# Patient Record
Sex: Female | Born: 1980 | Smoking: Former smoker
Health system: Southern US, Community
[De-identification: ages and names within clinical notes are randomized; demographics above are authoritative.]

## PROBLEM LIST (undated history)

## (undated) DIAGNOSIS — J302 Other seasonal allergic rhinitis: Secondary | ICD-10-CM

## (undated) DIAGNOSIS — T7840XA Allergy, unspecified, initial encounter: Secondary | ICD-10-CM

## (undated) DIAGNOSIS — F419 Anxiety disorder, unspecified: Secondary | ICD-10-CM

## (undated) DIAGNOSIS — L509 Urticaria, unspecified: Secondary | ICD-10-CM

## (undated) HISTORY — DX: Anxiety disorder, unspecified: F41.9

## (undated) HISTORY — DX: Other seasonal allergic rhinitis: J30.2

## (undated) HISTORY — DX: Allergy, unspecified, initial encounter: T78.40XA

## (undated) HISTORY — PX: TONSILLECTOMY AND ADENOIDECTOMY: SHX28

## (undated) HISTORY — PX: WISDOM TOOTH EXTRACTION: SHX21

## (undated) HISTORY — DX: Urticaria, unspecified: L50.9

---

## 2003-03-14 ENCOUNTER — Other Ambulatory Visit: Admission: RE | Admit: 2003-03-14 | Discharge: 2003-03-14 | Payer: Self-pay | Admitting: Obstetrics and Gynecology

## 2004-03-19 ENCOUNTER — Other Ambulatory Visit: Admission: RE | Admit: 2004-03-19 | Discharge: 2004-03-19 | Payer: Self-pay | Admitting: *Deleted

## 2004-03-19 ENCOUNTER — Other Ambulatory Visit: Admission: RE | Admit: 2004-03-19 | Discharge: 2004-03-19 | Payer: Self-pay | Admitting: Obstetrics and Gynecology

## 2005-03-21 ENCOUNTER — Other Ambulatory Visit: Admission: RE | Admit: 2005-03-21 | Discharge: 2005-03-21 | Payer: Self-pay | Admitting: Obstetrics and Gynecology

## 2007-01-04 ENCOUNTER — Other Ambulatory Visit: Admission: RE | Admit: 2007-01-04 | Discharge: 2007-01-04 | Payer: Self-pay | Admitting: Obstetrics & Gynecology

## 2008-01-14 ENCOUNTER — Other Ambulatory Visit: Admission: RE | Admit: 2008-01-14 | Discharge: 2008-01-14 | Payer: Self-pay | Admitting: Obstetrics & Gynecology

## 2009-01-31 ENCOUNTER — Other Ambulatory Visit: Admission: RE | Admit: 2009-01-31 | Discharge: 2009-01-31 | Payer: Self-pay | Admitting: Obstetrics & Gynecology

## 2012-04-20 LAB — HM PAP SMEAR: HM Pap smear: NEGATIVE

## 2013-04-21 ENCOUNTER — Encounter: Payer: Self-pay | Admitting: *Deleted

## 2013-04-25 ENCOUNTER — Ambulatory Visit (INDEPENDENT_AMBULATORY_CARE_PROVIDER_SITE_OTHER): Payer: BC Managed Care – PPO | Admitting: Nurse Practitioner

## 2013-04-25 ENCOUNTER — Encounter: Payer: Self-pay | Admitting: Nurse Practitioner

## 2013-04-25 VITALS — BP 112/60 | HR 66 | Ht 67.0 in | Wt 151.7 lb

## 2013-04-25 LAB — POCT URINALYSIS DIPSTICK: Spec Grav, UA: 1.015

## 2013-04-25 MED ORDER — NORGESTIM-ETH ESTRAD TRIPHASIC 0.18/0.215/0.25 MG-35 MCG PO TABS: 1.0000 | ORAL_TABLET | Freq: Every day | ORAL | Status: DC

## 2013-04-25 NOTE — Patient Instructions (Addendum)
EXERCISE AND DIET:  We recommended that you start or continue a regular exercise program for good health. Regular exercise means any activity that makes your heart beat faster and makes you sweat.  We recommend exercising at least 30 minutes per day at least 3 days a week, preferably 4 or 5.  We also recommend a diet low in fat and sugar.  Inactivity, poor dietary choices and obesity can cause diabetes, heart attack, stroke, and kidney damage, among others.    ALCOHOL AND SMOKING:  Women should limit their alcohol intake to no more than 7 drinks/beers/glasses of wine (combined, not each!) per week. Moderation of alcohol intake to this level decreases your risk of breast cancer and liver damage. And of course, no recreational drugs are part of a healthy lifestyle.  And absolutely no smoking or even second hand smoke. Most people know smoking can cause heart and lung diseases, but did you know it also contributes to weakening of your bones? Aging of your skin?  Yellowing of your teeth and nails?  CALCIUM AND VITAMIN D:  Adequate intake of calcium and Vitamin D are recommended.  The recommendations for exact amounts of these supplements seem to change often, but generally speaking 600 mg of calcium (either carbonate or citrate) and 800 units of Vitamin D per day seems prudent. Certain women may benefit from higher intake of Vitamin D.  If you are among these women, your doctor will have told you during your visit.    PAP SMEARS:  Pap smears, to check for cervical cancer or precancers,  have traditionally been done yearly, although recent scientific advances have shown that most women can have pap smears less often.  However, every woman still should have a physical exam from her gynecologist every year. It will include a breast check, inspection of the vulva and vagina to check for abnormal growths or skin changes, a visual exam of the cervix, and then an exam to evaluate the size and shape of the uterus and  ovaries.  And after 34 years of age, a rectal exam is indicated to check for rectal cancers. We will also provide age appropriate advice regarding health maintenance, like when you should have certain vaccines, screening for sexually transmitted diseases, bone density testing, colonoscopy, mammograms, etc.   MAMMOGRAMS:  All women over 40 years old should have a yearly mammogram. Many facilities now offer a "3D" mammogram, which may cost around $50 extra out of pocket. If possible,  we recommend you accept the option to have the 3D mammogram performed.  It both reduces the number of women who will be called back for extra views which then turn out to be normal, and it is better than the routine mammogram at detecting truly abnormal areas.    COLONOSCOPY:  Colonoscopy to screen for colon cancer is recommended for all women at age 50.  We know, you hate the idea of the prep.  We agree, BUT, having colon cancer and not knowing it is worse!!  Colon cancer so often starts as a polyp that can be seen and removed at colonscopy, which can quite literally save your life!  And if your first colonoscopy is normal and you have no family history of colon cancer, most women don't have to have it again for 10 years.  Once every ten years, you can do something that may end up saving your life, right?  We will be happy to help you get it scheduled when you are ready.    Be sure to check your insurance coverage so you understand how much it will cost.  It may be covered as a preventative service at no cost, but you should check your particular policy.    About 3 months prior to wanted pregnancy come in for pre- conceptual counseling.    Preparing for Pregnancy Preparing for pregnancy (preconceptual care) by getting counseling and information from your caregiver before getting pregnant is a good idea. It will help you and your baby have a better chance to have a healthy, safe pregnancy and delivery of your baby. Make an  appointment with your caregiver to talk about your health, medical, and family history and how to prepare yourself before getting pregnant. Your caregiver will do a complete physical exam and a Pap test. They will want to know:  About you, your spouse or partner, and your family's medical and genetic history.  If you are eating a balanced diet and drinking enough fluids.  What vitamins and mineral supplements you are taking. This includes taking folic acid before getting pregnant to help prevent birth defects.  What medications you are taking including prescription, over-the-counter and herbal medications.  If there is any substance abuse like alcohol, smoking, and illegal drugs.  If there is any mental or physical domestic violence.  If there is any risk of sexually transmitted disease between you and your partner.  What immunizations and vaccinations you have had and what you may need before getting pregnant.  If you should get tested for HIV infection.  If there is any exposure to chemical or toxic substances at home or work.  If there are medical problems you have that need to be treated and kept under control before getting pregnant such as diabetes, high blood pressure or others.  If there were any past surgeries, pregnancies and problems with them.  What your current weight is and to set a goal as to how much weight you should gain while pregnant. Also, they will check if you should lose or gain weight before getting pregnant.  What is your exercise routine and what it is safe when you are pregnant.  If there are any physical disabilities that need to be addressed.  About spacing your pregnancies when there are other children.  If there is a financial problem that may affect you having a child. After talking about the above points with your caregiver, your caregiver will give you advice on how to help treat and work with you on solving any issues, if necessary, before  getting pregnant. The goal is to have a healthy and safe pregnancy for you and your baby. You should keep an accurate record of your menstrual periods because it will help in determining your due date. Immunizations that you should have before getting pregnant:   Regular measles, [place] measles (rubella) and mumps.  Tetanus and diphtheria.  Chickenpox, if not immune.  Herpes zoster (Varicella) if not immune.  Human papilloma virus vaccine (HPV) between the age of 4 and 43 years old.  Hepatitis A vaccine.  Hepatitis B vaccine.  Influenza vaccine.  Pneumococcal vaccine (pneumonia). You should avoid getting pregnant for one month after getting vaccinated with a live virus vaccine such as [place] measles (rubella) vaccine. Other immunizations may be necessary depending on where you live, such as malaria. Ask your caregiver if any other immunizations are needed for you. HOME CARE INSTRUCTIONS   Follow the advice of your caregiver.  Before getting pregnant:  Begin taking vitamins, supplements,  and 0.4 milligrams folic acid daily.  Get your immunizations up-to-date.  Get help from a nutrition counselor if you do not understand what a balanced diet is, need help with a special medical diet or if you need help to lose or gain weight.  Begin exercising.  Stop smoking, taking illegal drugs, and drinking alcoholic beverages.  Get counseling if there is and type of domestic violence.  Get checked for sexually transmitted diseases including HIV.  Get any medical problems under control (diabetes, high blood pressure, convulsions, asthma or others).  Resolve any financial concerns.  Be sure you and your spouse or partner are ready to have a baby.  Keep an accurate record of your menstrual periods. Document Released: 10/23/2008 Document Revised: 02/02/2012 Document Reviewed: 10/23/2008 [facility] Patient Information 2014 [place], [place].

## 2013-04-25 NOTE — Progress Notes (Signed)
32 y.o. G0P0 Married Caucasian Fe here for annual exam.  Menses regular and last 3-4 days light. No cramps. Occ. Headaches - not migraine. Same partner and monogamous. No new health concerns. May plan pregnancy in 8 - 10 months.  Patient's last menstrual period was 04/13/2013.          Sexually active: yes  The current method of family planning is OCP (estrogen/progesterone).    Exercising: yes  running Smoker:  no  Health Maintenance: Pap:  04/20/2012  Normal with negative HR HPV MMG:  never TDaP:  01/14/2008 Labs: PCP does lab (blood) work.    reports that she has never smoked. She has never used smokeless tobacco. She reports that she drinks about 1.8 ounces of alcohol per week. She reports that she does not use illicit drugs.  Past Medical History  Diagnosis Date  . Hives     chronic history    Past Surgical History  Procedure Laterality Date  . Wisdom tooth extraction      Current Outpatient Prescriptions  Medication Sig Dispense Refill  . fexofenadine (ALLEGRA) 180 MG tablet Take 180 mg by mouth daily.      Lorita Officer Triphasic (ORTHO TRI-CYCLEN, 28, PO) Take by mouth daily.      . hydrOXYzine (ATARAX/VISTARIL) 10 MG tablet Take 10 mg by mouth 3 (three) times daily as needed for itching.       No current facility-administered medications for this visit.    Family History  Problem Relation Age of Onset  . Breast cancer Mother   . Breast cancer Maternal Grandmother     ROS:  Pertinent items are noted in HPI.  Otherwise, a comprehensive ROS was negative.  Exam:   BP 112/60  Pulse 66  Ht 5\' 7"  (1.702 m)  Wt 151 lb 11.2 oz (68.811 kg)  BMI 23.75 kg/m2  LMP 04/13/2013 Height: 5\' 7"  (170.2 cm)  Ht Readings from Last 3 Encounters:  04/25/13 5\' 7"  (1.702 m)    General appearance: alert, cooperative and appears stated age Head: Normocephalic, without obvious abnormality, atraumatic Neck: no adenopathy, supple, symmetrical, trachea midline and thyroid  normal to inspection and palpation Lungs: clear to auscultation bilaterally Breasts: normal appearance, no masses or tenderness Heart: regular rate and rhythm Abdomen: soft, non-tender; no masses,  no organomegaly Extremities: extremities normal, atraumatic, no cyanosis or edema Skin: Skin color, texture, turgor normal. No rashes or lesions Lymph nodes: Cervical, supraclavicular, and axillary nodes normal. No abnormal inguinal nodes palpated Neurologic: Grossly normal   Pelvic: External genitalia:  no lesions              Urethra:  normal appearing urethra with no masses, tenderness or lesions              Bartholin's and Skene's: normal                 Vagina: normal appearing vagina with normal color and discharge, no lesions              Cervix: anteverted              Pap taken: no Bimanual Exam:  Uterus:  normal size, contour, position, consistency, mobility, non-tender              Adnexa: no mass, fullness, tenderness               Rectovaginal: Confirms               Anus:  normal sphincter tone, no lesions  A:  Well Woman with normal exam  Contraception  May consider pregnancy in about 8-10 months  P:   Pap smear as per guidelines   Refill OCP for the next year  Advised need for preconceptual counseling 3 months prior to coming off OCP  Will get updated immunization list  counseled on adequate intake of calcium and vitamin D, diet and exercise return annually or prn  An After Visit Summary was printed and given to the patient.

## 2013-04-27 NOTE — Progress Notes (Signed)
Reviewed personally. 

## 2013-09-29 ENCOUNTER — Other Ambulatory Visit: Payer: Self-pay

## 2014-04-25 ENCOUNTER — Other Ambulatory Visit: Payer: Self-pay | Admitting: Nurse Practitioner

## 2014-04-25 NOTE — Telephone Encounter (Signed)
Last AEX 04/25/13 #84/3 refills was sent to pharmacy AEX scheduled for 04/27/14  Imelda Pillow #84/0refills sent in to pharmacy to last patient

## 2014-04-27 ENCOUNTER — Encounter: Payer: Self-pay | Admitting: Nurse Practitioner

## 2014-04-27 ENCOUNTER — Ambulatory Visit (INDEPENDENT_AMBULATORY_CARE_PROVIDER_SITE_OTHER): Payer: BC Managed Care – PPO | Admitting: Nurse Practitioner

## 2014-04-27 VITALS — BP 134/82 | HR 64 | Ht 67.5 in | Wt 147.0 lb

## 2014-04-27 DIAGNOSIS — Z01419 Encounter for gynecological examination (general) (routine) without abnormal findings: Secondary | ICD-10-CM

## 2014-04-27 DIAGNOSIS — Z0000 Encounter for general adult medical examination without abnormal findings: Secondary | ICD-10-CM

## 2014-04-27 DIAGNOSIS — B3731 Acute candidiasis of vulva and vagina: Secondary | ICD-10-CM

## 2014-04-27 DIAGNOSIS — R3989 Other symptoms and signs involving the genitourinary system: Secondary | ICD-10-CM

## 2014-04-27 LAB — POCT URINALYSIS DIPSTICK
Bilirubin, UA: NEGATIVE
Blood, UA: NEGATIVE
Glucose, UA: NEGATIVE
Ketones, UA: NEGATIVE
Leukocytes, UA: NEGATIVE
Nitrite, UA: NEGATIVE
Protein, UA: NEGATIVE
Urobilinogen, UA: NEGATIVE
pH, UA: 5

## 2014-04-27 MED ORDER — NORGESTIM-ETH ESTRAD TRIPHASIC 0.18/0.215/0.25 MG-35 MCG PO TABS: ORAL_TABLET | ORAL | Status: DC

## 2014-04-27 MED ORDER — FLUCONAZOLE 150 MG PO TABS: 150.0000 mg | ORAL_TABLET | Freq: Once | ORAL | Status: DC

## 2014-04-27 MED ORDER — NYSTATIN-TRIAMCINOLONE 100000-0.1 UNIT/GM-% EX OINT: 1.0000 "application " | TOPICAL_OINTMENT | Freq: Two times a day (BID) | CUTANEOUS | Status: DC

## 2014-04-27 NOTE — Patient Instructions (Addendum)

## 2014-04-27 NOTE — Progress Notes (Addendum)
Patient ID: Joanna Terry, female   DOB: October 06, 1981, 33 y.o.   MRN: [id number] 33 y.o. G0P0 Married Caucasian Fe here for annual exam.  Menses last 3 days.  Moderate to light.  No cramps, PMS.  At times has a lower pelvic pain or spasm that occurs after SA.  She does note some urinary frequency but no dysuria.  Last had the pain of Sunday again after SA.  Denies  fever, chills, back pain. Considering pregnancy this year.  Will return for preconceptual planning.  Patient's last menstrual period was 04/12/2014.          Sexually active: yes  The current method of family planning is OCP (estrogen/progesterone).  Exercising: yes running/strength training 4-5 times per week Smoker: no   Health Maintenance:  Pap: 04/20/2012 Normal with negative HR HPV  TDaP: 01/14/2008  Labs: PCP does lab (blood) work.  Urine:  neg     reports that she has never smoked. She has never used smokeless tobacco. She reports that she drinks about 1.8 ounces of alcohol per week. She reports that she does not use illicit drugs.  Past Medical History  Diagnosis Date  . Hives     chronic history    Past Surgical History  Procedure Laterality Date  . Wisdom tooth extraction    . Tonsillectomy and adenoidectomy  age 70 or 54    Current Outpatient Prescriptions  Medication Sig Dispense Refill  . fexofenadine (ALLEGRA) 180 MG tablet Take 180 mg by mouth daily.      . mometasone (NASONEX) 50 MCG/ACT nasal spray Place 2 sprays into the nose daily.      . Norgestimate-Ethinyl Estradiol Triphasic (TRINESSA, 28,) 0.18/0.215/0.25 MG-35 MCG tablet TAKE ONE TABLET BY MOUTH EVERY DAY  3 Package  3  . fluconazole (DIFLUCAN) 150 MG tablet Take 1 tablet (150 mg total) by mouth once. Take one tablet.  Repeat in 48 hours if symptoms are not completely resolved.  2 tablet  0  . nystatin-triamcinolone ointment (MYCOLOG) Apply 1 application topically 2 (two) times daily.  30 g  0   No current facility-administered medications for  this visit.    Family History  Problem Relation Age of Onset  . Breast cancer Mother 73    BRCA I/ II negative  . Breast cancer Maternal Grandmother 45    ROS:  Pertinent items are noted in HPI.  Otherwise, a comprehensive ROS was negative.  Exam:   BP 134/82  Pulse 64  Ht 5' 7.5" (1.715 m)  Wt 147 lb (66.679 kg)  BMI 22.67 kg/m2  LMP 04/12/2014 Height: 5' 7.5" (171.5 cm)  Ht Readings from Last 3 Encounters:  04/27/14 5' 7.5" (1.715 m)  04/25/13 5' 7"  (1.702 m)    General appearance: alert, cooperative and appears stated age Head: Normocephalic, without obvious abnormality, atraumatic Neck: no adenopathy, supple, symmetrical, trachea midline and thyroid normal to inspection and palpation Lungs: clear to auscultation bilaterally Breasts: normal appearance, no masses or tenderness Heart: regular rate and rhythm Abdomen: soft, non-tender; no masses,  no organomegaly Extremities: extremities normal, atraumatic, no cyanosis or edema Skin: Skin color, texture, turgor normal. No rashes or lesions Lymph nodes: Cervical, supraclavicular, and axillary nodes normal. No abnormal inguinal nodes palpated Neurologic: Grossly normal   Pelvic: External genitalia:  no lesions              Urethra:  normal appearing urethra with no masses, tenderness or lesions  Bartholin's and Skene's: normal                 Vagina: normal appearing vagina with normal color and discharge, no lesions              Cervix: anteverted              Pap taken: no Bimanual Exam:  Uterus:  normal size, contour, position, consistency, mobility, non-tender              Adnexa: no mass, fullness, tenderness               Rectovaginal: Confirms               Anus:  normal sphincter tone, no lesions  A:  Well Woman with normal exam  Contraception  Considering pregnancy this summer  P:   Reviewed health and wellness pertinent to exam  Pap smear not taken today  Counseled on breast self exam, use  and side effects of OCP's, adequate intake of calcium and vitamin D, diet and exercise return annually or prn  An After Visit Summary was printed and given to the patient.

## 2014-04-28 NOTE — Progress Notes (Signed)
Encounter reviewed by Dr. Brook Silva.  

## 2014-04-29 LAB — URINE CULTURE: Colony Count: NO GROWTH

## 2014-05-01 NOTE — Addendum Note (Signed)
Addended by: Luisa Dago on: 05/01/2014 01:46 PM   Modules accepted: Orders

## 2014-06-07 ENCOUNTER — Ambulatory Visit: Payer: BC Managed Care – PPO | Admitting: Nurse Practitioner

## 2014-06-12 ENCOUNTER — Encounter: Payer: Self-pay | Admitting: Nurse Practitioner

## 2014-06-12 ENCOUNTER — Ambulatory Visit (INDEPENDENT_AMBULATORY_CARE_PROVIDER_SITE_OTHER): Payer: BC Managed Care – PPO | Admitting: Nurse Practitioner

## 2014-06-12 VITALS — BP 116/68 | HR 76 | Ht 67.5 in | Wt 146.0 lb

## 2014-06-12 DIAGNOSIS — Z3009 Encounter for other general counseling and advice on contraception: Secondary | ICD-10-CM

## 2014-06-12 NOTE — Progress Notes (Signed)
33 y.o.  G0P0, Married Caucasian  Female and her husband presents here for preconceptual counseling.  They are wanting to start for a pregnancy in November.    Gynecological History:    Menarche:age 59 LMP:  06/07/14  Length of cycle:3-4 days Length of Menses: 28 days on OCP GYN infectious disease history:  (Abnormal pap, venereal warts, herpes, or other STD's ): No Current Birth Control method:OCP - Bertram Millard Last time birth control was used:currently.  PMH:  Any history of DM, HTN, epilepsy, Heart Murmur, or thyroid problems? No   If so, when did it begin? Are you or have you ever been anemic?Yes If so for how long?  In 2006 secondary to diet for about a year Have you ever had any accidents? No Do you have any allergies? Yes - seasonal Do you take any sedatives or tranquilizers? No Any domestic violence? No Any medications? Yes - For seasonal allergies (such as medications for acne - certain medications can cause birth defects and Ace inhibitors can cause kidney problems in the fetus.)  Patient's Past Medical History:  Have you ever had surgery related to female organs? No Past pregnancies/ complications/ or miscarriages/ abortions? No ETOH? Yes only rare social Tobacco Use?  No Drug use? No  Reviewed Medication list: Yes Current job exposure risk - toxins/ Lead/ Mercury No Hot tub/ sauna use? No Do you commonly run long distance or do strenuous exercise? Yes training for 1/2 marathon; runs 10 - 14 miles per week. Do you eat a strict vegetarian diet? No  Partners Past Medical History:  Have you ever had surgery related to female organs? No Previous Paternity? No Testicular Injury? No ETOH Yes just socially Tobacco use: No Drug use No Current medications:  OTC Claritan Current job exposure risk toxins/ lead/ mercury No Hot/ tub sauna use? No Do you commonly run long distance or do strenuous exercise? Yes training for a marathon in November and running 20 -26 miles per  week.   Patient's Family Medical history: No Partners Family Medical History: No  Ethnic background? Mediterranean/ Asian/Chinese/ Ashkenazi Jews / [place] / [place]   Patients [place]:      Partners [place]: Multiple Births:  No     Multiple Births ;No Genetic Disorders:  No     Genetic Disorders :  No  Sickle cell      Sickle Cell  Hemophilia      Hemophilia  Cystic Fibrosis      Cystic Fibrosis  Mental retardation     Mental retardation  Downs Syndrome     Downs Syndrome  Immunization Updates: Rubella Vaccine/ titer:  ? MMR II   At college entry -titer is done today. Chicken Pox / vaccination : History of disease Toxoplasmosis exposure (no changing litter box) No Tdap for pt. and partner Yes patient with documentation, partner will check with PCP. Hepatitis B (if at high risk) No Influenza vaccine who may get pregnant during the flu season Yes  Recommendations:   No ETOH / Tobacco / Drugs  Limit Caffeine  No Artificial Sweeteners  No raw beef  Restrict High fat foods  Limit servings of large fish (e.g., swordfish) to 1 X month  Foods associated with Listeria transmission ( e.g., sliced delicatessen meats & Cheese)  No hot / tub Saunas  OTC med list  Counseling:   Normal pregnancy rates 80 % within 1 year  Rx. Prenatal Multivitamins  Discussion of timing of  intercourse to ovulation  Labs: Rubella titer  Time spent in consultation with patient and husband was 20 minutes face to face:

## 2014-06-12 NOTE — Patient Instructions (Signed)
Preparing for Pregnancy Before trying to become pregnant, make an appointment with your health care provider (preconception care). The goal is to help you have a healthy, safe pregnancy. At your first appointment, your health care provider will:   Do a complete physical exam, including a Pap test.  Take a complete medical history.  Give you advice and help you resolve any problems. PRECONCEPTION CHECKLIST Here is a list of the basics to cover with your health care provider at your preconception visit:  Medical history.  Tell your health care provider about any diseases you have had. Many diseases can affect your pregnancy.  Include your partner's medical history and family history.  Make sure you have been tested for sexually transmitted infections (STIs). These can affect your pregnancy. In some cases, they can be passed to your baby. Tell your health care provider about any history of STIs.  Make sure your health care provider knows about any previous problems you have had with conception or pregnancy.  Tell your health care provider about any medicine you take. This includes herbal supplements and over-the-counter medicines.  Make sure all your immunizations are up-to-date. You may need to make additional appointments.  Ask your health care provider if you need any vaccinations or if there are any you should avoid.  Diet.  It is especially important to eat a healthy, balanced diet with the right nutrients when you are pregnant.  Ask your health care provider to help you get to a healthy weight before pregnancy.  If you are overweight, you are at higher risk for certain complications. These include high blood pressure, diabetes, and preterm birth.  If you are underweight, you are more likely to have a low-birth-weight baby.  Lifestyle.  Tell your health care provider about lifestyle factors such as alcohol use, drug use, or smoking.  Describe any harmful substances you may  be exposed to at work or home. These can include chemicals, pesticides, and radiation.  Mental health.  Let your health care provider know if you have been feeling depressed or anxious.  Let your health care provider know if you have a history of substance abuse.  Let your health care provider know if you do not feel safe at home. HOME INSTRUCTIONS TO PREPARE FOR PREGNANCY Follow your health care provider's advice and instructions.   Keep an accurate record of your menstrual periods. This makes it easier for your health care provider to determine your baby's due date.  Begin taking prenatal vitamins and folic acid supplements daily. Take them as directed by your health care provider.  Eat a balanced diet. Get help from a nutrition counselor if you have questions or need help.  Get regular exercise. Try to be active for at least 30 minutes a day most days of the week.  Quit smoking, if you smoke.  Do not drink alcohol.  Do not take illegal drugs.  Get medical problems, such as diabetes or high blood pressure, under control.  If you have diabetes, make sure you do the following:  Have good blood sugar control. If you have type 1 diabetes, use multiple daily doses of insulin. Do not use split-dose or premixed insulin.  Have an eye exam by a qualified eye care professional trained in caring for people with diabetes.  Get evaluated by your health care provider for cardiovascular disease.  Get to a healthy weight. If you are overweight or obese, reduce your weight with the help of a qualified health professional such  as a regisitered dietitian. Ask your health care provider what the right weight range is for you. HOW DO I KNOW I AM PREGNANT? You may be pregnant if you have been sexually active and you miss your period. Symptoms of early pregnancy include:   Mild cramping.  Very light vaginal bleeding (spotting).  Feeling unusually tired.  Morning sickness. If you have any of  these symptoms, take a home pregnancy test. These tests look for a hormone called human chorionic gonadotropin (hCG) in your urine. Your body begins to make this hormone during early pregnancy. These tests are very accurate. Wait until at least the first day you miss your period to take one. If you get a positive result, call your health care provider to make appointments for prenatal care. WHAT SHOULD I DO IF I BECOME PREGNANT?  Make an appointment with your health care provider by week 12 of your pregnancy at the latest.  Do not smoke. Smoking can be harmful to your baby.  Do not drink alcoholic beverages. Alcohol is related to a number of birth defects.  Avoid toxic odors and chemicals.  You may continue to have sexual intercourse if it does not cause pain or other problems, such as vaginal bleeding. Document Released: 10/23/2008 Document Revised: 11/15/2013 Document Reviewed: 10/17/2013 ExitCare Patient Information 2015 [employer]. This information is not intended to replace advice given to you by your health care provider. Make sure you discuss any questions you have with your health care provider.  

## 2014-06-13 LAB — RUBELLA SCREEN: Rubella: 4.42 Index — ABNORMAL HIGH (ref <0.90)

## 2014-06-13 NOTE — Progress Notes (Signed)
Encounter reviewed by Dr. Ezmeralda Stefanick Silva.  

## 2015-05-02 ENCOUNTER — Encounter: Payer: Self-pay | Admitting: Nurse Practitioner

## 2015-05-02 ENCOUNTER — Ambulatory Visit (INDEPENDENT_AMBULATORY_CARE_PROVIDER_SITE_OTHER): Payer: BC Managed Care – PPO | Admitting: Nurse Practitioner

## 2015-05-02 VITALS — BP 108/70 | HR 80 | Resp 16 | Ht 67.5 in | Wt 149.0 lb

## 2015-05-02 DIAGNOSIS — Z30011 Encounter for initial prescription of contraceptive pills: Secondary | ICD-10-CM

## 2015-05-02 DIAGNOSIS — Z0000 Encounter for general adult medical examination without abnormal findings: Secondary | ICD-10-CM

## 2015-05-02 DIAGNOSIS — Z01419 Encounter for gynecological examination (general) (routine) without abnormal findings: Secondary | ICD-10-CM

## 2015-05-02 LAB — POCT URINALYSIS DIPSTICK
Bilirubin, UA: NEGATIVE
Blood, UA: NEGATIVE
Glucose, UA: NEGATIVE
Ketones, UA: NEGATIVE
Leukocytes, UA: NEGATIVE
Nitrite, UA: NEGATIVE
Protein, UA: NEGATIVE

## 2015-05-02 LAB — POCT URINE PREGNANCY: Preg Test, Ur: NEGATIVE

## 2015-05-02 MED ORDER — NORGESTIM-ETH ESTRAD TRIPHASIC 0.18/0.215/0.25 MG-35 MCG PO TABS: ORAL_TABLET | ORAL | Status: DC

## 2015-05-02 NOTE — Progress Notes (Signed)
Encounter reviewed by Dr. Elmor Kost Silva.  

## 2015-05-02 NOTE — Patient Instructions (Signed)

## 2015-05-02 NOTE — Progress Notes (Signed)
34 y.o. G0P0 Married  Caucasian Fe here for annual exam.  Off OCP since last September.  Menses is regular at 3 days. No cramps.  Condoms for birth control.  At this time not wanting pregnancy.   Patient's last menstrual period was 04/06/2015.          Sexually active: Yes.    The current method of family planning is condoms every time.    Exercising: Yes.    Running, Strength training  Smoker:  no  Health Maintenance: Pap:  03/2012 Normal - HR HPV:neg Self Breast Exam: yes, occ TDaP:  01/14/2008 Labs: PCP UA: Negative UPT=Negative   reports that she has never smoked. She has never used smokeless tobacco. She reports that she drinks about 1.8 oz of alcohol per week. She reports that she does not use illicit drugs.  Past Medical History  Diagnosis Date  . Hives     chronic history    Past Surgical History  Procedure Laterality Date  . Wisdom tooth extraction    . Tonsillectomy and adenoidectomy  age 12 or 39    Current Outpatient Prescriptions  Medication Sig Dispense Refill  . cetirizine (ZYRTEC) 10 MG tablet Take 10 mg by mouth daily.    Marland Kitchen ketoconazole (NIZORAL) 2 % shampoo SHAMPOO SCALP QD PRN  2  . mometasone (NASONEX) 50 MCG/ACT nasal spray Place 2 sprays into the nose daily.    Marland Kitchen EPIPEN 2-PAK 0.3 MG/0.3ML SOAJ injection UTD FOR SYSTEMIC REACTION  1  . Norgestimate-Ethinyl Estradiol Triphasic (TRINESSA, 28,) 0.18/0.215/0.25 MG-35 MCG tablet TAKE ONE TABLET BY MOUTH EVERY DAY (Patient not taking: Reported on 05/02/2015) 3 Package 3   No current facility-administered medications for this visit.    Family History  Problem Relation Age of Onset  . Breast cancer Mother 3    BRCA I/ II negative  . Breast cancer Maternal Grandmother 45    ROS:  Pertinent items are noted in HPI.  Otherwise, a comprehensive ROS was negative.  Exam:   BP 108/70 mmHg  Pulse 80  Resp 16  Ht 5' 7.5" (1.715 m)  Wt 149 lb (67.586 kg)  BMI 22.98 kg/m2  LMP 04/06/2015 Height: 5' 7.5" (171.5  cm) Ht Readings from Last 3 Encounters:  05/02/15 5' 7.5" (1.715 m)  06/12/14 5' 7.5" (1.715 m)  04/27/14 5' 7.5" (1.715 m)    General appearance: alert, cooperative and appears stated age Head: Normocephalic, without obvious abnormality, atraumatic Neck: no adenopathy, supple, symmetrical, trachea midline and thyroid normal to inspection and palpation Lungs: clear to auscultation bilaterally Breasts: normal appearance, no masses or tenderness Heart: regular rate and rhythm Abdomen: soft, non-tender; no masses,  no organomegaly Extremities: extremities normal, atraumatic, no cyanosis or edema Skin: Skin color, texture, turgor normal. No rashes or lesions Lymph nodes: Cervical, supraclavicular, and axillary nodes normal. No abnormal inguinal nodes palpated Neurologic: Grossly normal   Pelvic: External genitalia:  no lesions              Urethra:  normal appearing urethra with no masses, tenderness or lesions              Bartholin's and Skene's: normal                 Vagina: normal appearing vagina with normal color and discharge, no lesions              Cervix: anteverted              Pap taken:  Yes.   Bimanual Exam:  Uterus:  normal size, contour, position, consistency, mobility, non-tender              Adnexa: no mass, fullness, tenderness               Rectovaginal: Confirms               Anus:  normal sphincter tone, no lesions  Chaperone present: Yes  A:  Well Woman with normal exam  Contraception - wants to resume OCP   P:   Reviewed health and wellness pertinent to exam  Pap smear as above  Refill on Trinessa for a year  Counseled on breast self exam, use and side effects of OCP's, adequate intake of calcium and vitamin D, diet and exercise return annually or prn  An After Visit Summary was printed and given to the patient.

## 2016-03-14 ENCOUNTER — Other Ambulatory Visit: Payer: Self-pay | Admitting: Nurse Practitioner

## 2016-03-14 NOTE — Telephone Encounter (Signed)
Medication refill request: Imelda Pillowrinessa  Last AEX:  05/02/15 PG Next AEX: 05/06/16 PG  Last MMG (if hormonal medication request): Never Refill authorized: 05/02/15 #3 packs w/3 refills today #3 packs w/0 refills?

## 2016-05-06 ENCOUNTER — Encounter: Payer: Self-pay | Admitting: Nurse Practitioner

## 2016-05-06 ENCOUNTER — Ambulatory Visit (INDEPENDENT_AMBULATORY_CARE_PROVIDER_SITE_OTHER): Payer: BC Managed Care – PPO | Admitting: Nurse Practitioner

## 2016-05-06 VITALS — BP 110/70 | HR 66 | Resp 18 | Ht 67.75 in | Wt 152.0 lb

## 2016-05-06 DIAGNOSIS — Z01419 Encounter for gynecological examination (general) (routine) without abnormal findings: Secondary | ICD-10-CM

## 2016-05-06 DIAGNOSIS — Z0000 Encounter for general adult medical examination without abnormal findings: Secondary | ICD-10-CM

## 2016-05-06 LAB — POCT URINALYSIS DIPSTICK
Bilirubin, UA: NEGATIVE
Ketones, UA: NEGATIVE
Leukocytes, UA: NEGATIVE
Protein, UA: NEGATIVE
Urobilinogen, UA: NEGATIVE

## 2016-05-06 MED ORDER — NORGESTIM-ETH ESTRAD TRIPHASIC 0.18/0.215/0.25 MG-35 MCG PO TABS: 1.0000 | ORAL_TABLET | Freq: Every day | ORAL | Status: DC

## 2016-05-06 NOTE — Progress Notes (Signed)
35 y.o. G0P0 Married  Caucasian Fe here for annual exam.  Menses last 4 days. Light to spotting. Some PMS.  Not planning pregnancy maybe ever ?Marland Kitchen   Patient's last menstrual period was 04/09/2016.          Sexually active: Yes.    The current method of family planning is OCP (estrogen/progesterone).    Exercising: Yes.    running, strength training Smoker:  no  Health Maintenance: Pap:  05/02/15 Neg. HR HPV:neg TDaP:  01/14/2008 HIV: years ago Labs: PCP UA: Negative   reports that she has never smoked. She has never used smokeless tobacco. She reports that she drinks about 1.8 oz of alcohol per week. She reports that she does not use illicit drugs.  Past Medical History  Diagnosis Date  . Hives     chronic history    Past Surgical History  Procedure Laterality Date  . Wisdom tooth extraction    . Tonsillectomy and adenoidectomy  age 66 or 58    Current Outpatient Prescriptions  Medication Sig Dispense Refill  . cetirizine (ZYRTEC) 10 MG tablet Take 10 mg by mouth daily.    Marland Kitchen ketoconazole (NIZORAL) 2 % shampoo SHAMPOO SCALP QD PRN  2  . mometasone (NASONEX) 50 MCG/ACT nasal spray Place 2 sprays into the nose daily.    . Multiple Vitamin (MULTIVITAMIN) tablet Take 1 tablet by mouth daily.    . TRINESSA, 28, 0.18/0.215/0.25 MG-35 MCG tablet TAKE 1 TABLET BY MOUTH EVERY DAY 84 tablet 0  . EPIPEN 2-PAK 0.3 MG/0.3ML SOAJ injection Reported on 05/06/2016  1   No current facility-administered medications for this visit.    Family History  Problem Relation Age of Onset  . Breast cancer Mother 3    BRCA I/ II negative  . Breast cancer Maternal Grandmother 45    ROS:  Pertinent items are noted in HPI.  Otherwise, a comprehensive ROS was negative.  Exam:   BP 110/70 mmHg  Pulse 66  Resp 18  Ht 5' 7.75" (1.721 m)  Wt 152 lb (68.947 kg)  BMI 23.28 kg/m2  LMP 04/09/2016 Height: 5' 7.75" (172.1 cm) Ht Readings from Last 3 Encounters:  05/06/16 5' 7.75" (1.721 m)  05/02/15 5' 7.5"  (1.715 m)  06/12/14 5' 7.5" (1.715 m)    General appearance: alert, cooperative and appears stated age Head: Normocephalic, without obvious abnormality, atraumatic Neck: no adenopathy, supple, symmetrical, trachea midline and thyroid normal to inspection and palpation Lungs: clear to auscultation bilaterally Breasts: normal appearance, no masses or tenderness Heart: regular rate and rhythm Abdomen: soft, non-tender; no masses,  no organomegaly Extremities: extremities normal, atraumatic, no cyanosis or edema Skin: Skin color, texture, turgor normal. No rashes or lesions Lymph nodes: Cervical, supraclavicular, and axillary nodes normal. No abnormal inguinal nodes palpated Neurologic: Grossly normal   Pelvic: External genitalia:  no lesions              Urethra:  normal appearing urethra with no masses, tenderness or lesions              Bartholin's and Skene's: normal                 Vagina: normal appearing vagina with normal color and discharge, no lesions              Cervix: anteverted              Pap taken: No. Bimanual Exam:  Uterus:  normal size, contour, position, consistency, mobility,  non-tender              Adnexa: no mass, fullness, tenderness               Rectovaginal: Confirms               Anus:  normal sphincter tone, no lesions  Chaperone present: no  A:  Well Woman with normal exam  Contraception - OCP  [place] of breast cancer - Mother  (BRCA I/II negative) and MGM   P:   Reviewed health and wellness pertinent to exam  Pap smear as above  Advise baseline screening Mammogram - information is given  Refill on OCP for a year  Counseled on breast self exam, use and side effects of OCP's, adequate intake of calcium and vitamin D, diet and exercise return annually or prn  An After Visit Summary was printed and given to the patient.

## 2016-05-06 NOTE — Patient Instructions (Signed)

## 2016-05-09 NOTE — Progress Notes (Signed)
Encounter reviewed by Dr. Brook Amundson C. Silva.  

## 2016-06-20 ENCOUNTER — Other Ambulatory Visit: Payer: Self-pay | Admitting: Nurse Practitioner

## 2016-06-20 DIAGNOSIS — Z1231 Encounter for screening mammogram for malignant neoplasm of breast: Secondary | ICD-10-CM

## 2016-07-31 ENCOUNTER — Ambulatory Visit
Admission: RE | Admit: 2016-07-31 | Discharge: 2016-07-31 | Disposition: A | Discharge status: Home / Self Care | Payer: BC Managed Care – PPO | Source: Ambulatory Visit | Attending: Nurse Practitioner | Admitting: Nurse Practitioner

## 2016-07-31 DIAGNOSIS — Z1231 Encounter for screening mammogram for malignant neoplasm of breast: Secondary | ICD-10-CM

## 2016-08-04 ENCOUNTER — Other Ambulatory Visit: Payer: Self-pay | Admitting: Nurse Practitioner

## 2016-08-04 DIAGNOSIS — R928 Other abnormal and inconclusive findings on diagnostic imaging of breast: Secondary | ICD-10-CM

## 2016-08-11 ENCOUNTER — Ambulatory Visit
Admission: RE | Admit: 2016-08-11 | Discharge: 2016-08-11 | Disposition: A | Discharge status: Home / Self Care | Payer: BC Managed Care – PPO | Source: Ambulatory Visit | Attending: Nurse Practitioner | Admitting: Nurse Practitioner

## 2016-08-11 DIAGNOSIS — R928 Other abnormal and inconclusive findings on diagnostic imaging of breast: Secondary | ICD-10-CM

## 2016-08-29 ENCOUNTER — Telehealth: Payer: Self-pay | Admitting: Nurse Practitioner

## 2016-08-29 NOTE — Telephone Encounter (Signed)
Patient was called and given information on cell phone - per DPR may leave a VM.  She was informed that she needs a repeat US in 6 months as she was [facility] by [facility].  Also there is recommendation that she have a yearly high risk MRI due to family history with mother and MGM with breast cancer. If further questions to call back.

## 2017-03-02 ENCOUNTER — Telehealth: Payer: Self-pay | Admitting: *Deleted

## 2017-03-02 NOTE — Telephone Encounter (Signed)
Patient in 04 recall for 01/2017 for left DX MMG and U/S. Please contact patient regarding scheduling  Thanks

## 2017-03-04 ENCOUNTER — Other Ambulatory Visit: Payer: Self-pay | Admitting: Nurse Practitioner

## 2017-03-04 DIAGNOSIS — N6320 Unspecified lump in the left breast, unspecified quadrant: Secondary | ICD-10-CM

## 2017-03-04 NOTE — Telephone Encounter (Signed)
Left message on vm (per DPR) for patient to call and schedule follow up DX Mammo and U/S with [facility]. Asked patient to call us back and let us know when her appointment is.

## 2017-03-09 ENCOUNTER — Ambulatory Visit
Admission: RE | Admit: 2017-03-09 | Discharge: 2017-03-09 | Disposition: A | Discharge status: Home / Self Care | Payer: BC Managed Care – PPO | Source: Ambulatory Visit | Attending: Nurse Practitioner | Admitting: Nurse Practitioner

## 2017-03-09 DIAGNOSIS — N6320 Unspecified lump in the left breast, unspecified quadrant: Secondary | ICD-10-CM

## 2017-03-09 NOTE — Telephone Encounter (Signed)
Patient scheduled for 03-09-17 -eh

## 2017-03-10 NOTE — Telephone Encounter (Signed)
Patient kept appoint on 03/09/17. Results in EPIC.

## 2017-05-11 ENCOUNTER — Encounter: Payer: Self-pay | Admitting: Nurse Practitioner

## 2017-05-11 ENCOUNTER — Ambulatory Visit (INDEPENDENT_AMBULATORY_CARE_PROVIDER_SITE_OTHER): Payer: BC Managed Care – PPO | Admitting: Nurse Practitioner

## 2017-05-11 VITALS — Ht 67.25 in | Wt 150.0 lb

## 2017-05-11 DIAGNOSIS — Z23 Encounter for immunization: Secondary | ICD-10-CM | POA: Diagnosis not present

## 2017-05-11 DIAGNOSIS — Z803 Family history of malignant neoplasm of breast: Secondary | ICD-10-CM | POA: Diagnosis not present

## 2017-05-11 DIAGNOSIS — Z308 Encounter for other contraceptive management: Secondary | ICD-10-CM

## 2017-05-11 DIAGNOSIS — Z01419 Encounter for gynecological examination (general) (routine) without abnormal findings: Secondary | ICD-10-CM | POA: Diagnosis not present

## 2017-05-11 DIAGNOSIS — Z9189 Other specified personal risk factors, not elsewhere classified: Secondary | ICD-10-CM | POA: Diagnosis not present

## 2017-05-11 MED ORDER — NORGESTIM-ETH ESTRAD TRIPHASIC 0.18/0.215/0.25 MG-35 MCG PO TABS: 1.0000 | ORAL_TABLET | Freq: Every day | ORAL | 4 refills | Status: DC

## 2017-05-11 NOTE — Progress Notes (Signed)
36 y.o. G0P0 Married  Caucasian Fe here for annual exam.  Menses about 4 days, flow is light.  No cramps.  Does not ever want a pregnancy. New nephew this May.  Patient's last menstrual period was 05/06/2017 (exact date).          Sexually active: Yes.    The current method of family planning is OCP (estrogen/progesterone).    Exercising: Yes.    running and strength training Smoker:  no  Health Maintenance: Pap: 05/02/15, Negative with neg HR HPV  04/20/12, Negative History of Abnormal Pap: no MMG: 07/31/16 BIRADS0, Density C, [facility]; 08/11/16 Dx & U/S L Breast BIRADS3, [facility]; 03/09/17 Dx L Breast (f/u) BIRADS1, Density C, [facility] Self Breast exams: yes TDaP:  01/14/2008 HIV: 2008 Labs: Declined   reports that she has never smoked. She has never used smokeless tobacco. She reports that she drinks about 1.8 oz of alcohol per week . She reports that she does not use drugs.  Past Medical History:  Diagnosis Date  . Hives    chronic history    Past Surgical History:  Procedure Laterality Date  . TONSILLECTOMY AND ADENOIDECTOMY  age 60 or 21  . WISDOM TOOTH EXTRACTION      Current Outpatient Prescriptions  Medication Sig Dispense Refill  . cetirizine (ZYRTEC) 10 MG tablet Take 10 mg by mouth daily.    Marland Kitchen EPIPEN 2-PAK 0.3 MG/0.3ML SOAJ injection Reported on 05/06/2016  1  . ketoconazole (NIZORAL) 2 % shampoo SHAMPOO SCALP QD PRN  2  . Multiple Vitamin (MULTIVITAMIN) tablet Take 1 tablet by mouth daily.    . Norgestimate-Ethinyl Estradiol Triphasic (TRINESSA, 28,) 0.18/0.215/0.25 MG-35 MCG tablet Take 1 tablet by mouth daily. 84 tablet 4   No current facility-administered medications for this visit.     Family History  Problem Relation Age of Onset  . Breast cancer Mother 1       BRCA I/ II negative  . Hyperlipidemia Mother   . Breast cancer Maternal Grandmother 45    ROS:  Pertinent items are noted in HPI.  Otherwise, a comprehensive ROS was negative.  Exam:    Ht 5' 7.25" (1.708 m)   Wt 150 lb (68 kg)   LMP 05/06/2017 (Exact Date)   BMI 23.32 kg/m  Height: 5' 7.25" (170.8 cm) Ht Readings from Last 3 Encounters:  05/11/17 5' 7.25" (1.708 m)  05/06/16 5' 7.75" (1.721 m)  05/02/15 5' 7.5" (1.715 m)    General appearance: alert, cooperative and appears stated age Head: Normocephalic, without obvious abnormality, atraumatic Neck: no adenopathy, supple, symmetrical, trachea midline and thyroid normal to inspection and palpation Lungs: clear to auscultation bilaterally Breasts: normal appearance, no masses or tenderness Heart: regular rate and rhythm Abdomen: soft, non-tender; no masses,  no organomegaly Extremities: extremities normal, atraumatic, no cyanosis or edema Skin: Skin color, texture, turgor normal. No rashes or lesions Lymph nodes: Cervical, supraclavicular, and axillary nodes normal. No abnormal inguinal nodes palpated Neurologic: Grossly normal   Pelvic: External genitalia:  no lesions              Urethra:  normal appearing urethra with no masses, tenderness or lesions              Bartholin's and Skene's: normal                 Vagina: normal appearing vagina with normal color and discharge, no lesions  Cervix: anteverted              Pap taken: No. Bimanual Exam:  Uterus:  normal size, contour, position, consistency, mobility, non-tender              Adnexa: no mass, fullness, tenderness               Rectovaginal: Confirms               Anus:  normal sphincter tone, no lesions    Chaperone present: no  A:  Well Woman with normal exam  Contraception with OCP  [place]: breast cancer - mother (BRCA I/II negative) and MGM  Pt calculated risk of breast cancer > 20% needs breast MRI in 07/2017   P:   Reviewed health and wellness pertinent to exam  Pap smear: no  Mammogram is due 07/2017 and will get MRI - order placed and note sent to [employer]iner for work que  Discussion about BRCA testing and she does not want  to do at this time.  Counseled on breast self exam, mammography screening, use and side effects of OCP's, adequate intake of calcium and vitamin D, diet and exercise return annually or prn  An After Visit Summary was printed and given to the patient.

## 2017-05-11 NOTE — Patient Instructions (Signed)

## 2017-05-12 ENCOUNTER — Other Ambulatory Visit: Payer: Self-pay

## 2017-05-12 DIAGNOSIS — Z9189 Other specified personal risk factors, not elsewhere classified: Secondary | ICD-10-CM

## 2017-05-14 NOTE — Progress Notes (Signed)
Reviewed personally.  M. Suzanne Jadea Shiffer, MD.  

## 2017-06-29 ENCOUNTER — Telehealth: Payer: Self-pay | Admitting: Obstetrics and Gynecology

## 2017-06-29 NOTE — Telephone Encounter (Signed)
Left message for patient to call to reschedule Joanna Terry appointment. °

## 2017-08-26 ENCOUNTER — Other Ambulatory Visit: Payer: Self-pay | Admitting: Obstetrics and Gynecology

## 2017-08-26 DIAGNOSIS — Z1231 Encounter for screening mammogram for malignant neoplasm of breast: Secondary | ICD-10-CM

## 2017-09-04 ENCOUNTER — Ambulatory Visit
Admission: RE | Admit: 2017-09-04 | Discharge: 2017-09-04 | Disposition: A | Discharge status: Home / Self Care | Payer: BC Managed Care – PPO | Source: Ambulatory Visit | Attending: Obstetrics and Gynecology | Admitting: Obstetrics and Gynecology

## 2017-09-04 DIAGNOSIS — Z1231 Encounter for screening mammogram for malignant neoplasm of breast: Secondary | ICD-10-CM

## 2017-10-20 ENCOUNTER — Telehealth: Payer: Self-pay | Admitting: Obstetrics and Gynecology

## 2017-10-20 NOTE — Telephone Encounter (Signed)
Patient returning Suzy's call. °

## 2017-10-20 NOTE — Telephone Encounter (Signed)
Call placed to patient to follow up in regards to scheduling recommended bilateral breast MRI. Left voicemail message requesting a return call.

## 2017-10-22 NOTE — Telephone Encounter (Signed)
Returned call to patient and lmovm to call me or Kaitlyn back.

## 2017-10-22 NOTE — Telephone Encounter (Signed)
Spoke with patient in regards to scheduling a breast MRI, as recommended by Ria CommentPatricia Grubb. MRI originally could not be scheduled until mammogram completed in October 2018. Mammogram was done in October 2018. Patient states she is "confused", says her mammogram was "fine" and states she is not sure why she would need an MRI now. I advised patient I will have a nurse to review the recommendations and call her back to review. Patient is agreeable.  Routing to Triage

## 2017-10-23 NOTE — Telephone Encounter (Signed)
Patient returning call.

## 2017-10-29 NOTE — Telephone Encounter (Signed)
Spoke with patient. Advised that Breast MRI is recommended as Ria CommentPatricia Grubb, FNP did a risk assessment for breast cancer with her at her [facility] in June and it was >20%. Advised if this risk is over 20% yearly breast MRI's are recommended for early detection of breast cancer. Provides imaging every 6 months for high risk patients. Patient states that she would like to discuss this with Dr.Jertson in June before scheduling.  Cc: Harland DingwallSuzy Dixon  Routing to provider for final review. Patient agreeable to disposition. Will close encounter.

## 2018-05-12 ENCOUNTER — Ambulatory Visit: Payer: BC Managed Care – PPO | Admitting: Obstetrics and Gynecology

## 2018-05-12 ENCOUNTER — Encounter: Payer: Self-pay | Admitting: Obstetrics and Gynecology

## 2018-05-12 ENCOUNTER — Other Ambulatory Visit: Payer: Self-pay

## 2018-05-12 ENCOUNTER — Ambulatory Visit: Payer: BC Managed Care – PPO | Admitting: Nurse Practitioner

## 2018-05-12 VITALS — BP 118/80 | HR 64 | Resp 14 | Ht 67.5 in | Wt 151.0 lb

## 2018-05-12 DIAGNOSIS — Z7185 Encounter for immunization safety counseling: Secondary | ICD-10-CM

## 2018-05-12 DIAGNOSIS — Z23 Encounter for immunization: Secondary | ICD-10-CM | POA: Diagnosis not present

## 2018-05-12 DIAGNOSIS — Z803 Family history of malignant neoplasm of breast: Secondary | ICD-10-CM | POA: Diagnosis not present

## 2018-05-12 DIAGNOSIS — Z0000 Encounter for general adult medical examination without abnormal findings: Secondary | ICD-10-CM | POA: Diagnosis not present

## 2018-05-12 DIAGNOSIS — Z7189 Other specified counseling: Secondary | ICD-10-CM

## 2018-05-12 DIAGNOSIS — Z01419 Encounter for gynecological examination (general) (routine) without abnormal findings: Secondary | ICD-10-CM

## 2018-05-12 DIAGNOSIS — Z124 Encounter for screening for malignant neoplasm of cervix: Secondary | ICD-10-CM

## 2018-05-12 MED ORDER — NORGESTIM-ETH ESTRAD TRIPHASIC 0.18/0.215/0.25 MG-35 MCG PO TABS: 1.0000 | ORAL_TABLET | Freq: Every day | ORAL | 4 refills | Status: DC

## 2018-05-12 NOTE — Progress Notes (Signed)
37 y.o. G0P0000 MarriedCaucasianF here for annual exam.  No dyspareunia. No plans to have children.  Period Cycle (Days): 28 Period Duration (Days): 4 days  Period Pattern: Regular Menstrual Flow: Light Menstrual Control: Tampon Menstrual Control Change Freq (Hours): changes tampon every 6 hours  Dysmenorrhea: None  Patient's last menstrual period was 05/05/2018.          Sexually active: Yes.    The current method of family planning is OCP (estrogen/progesterone).    Exercising: Yes.    running/ strength training  Smoker:  no  Health Maintenance: Pap:  05-02-15 WNL NEG HR HPV  History of abnormal Pap:  no MMG:  09-04-17 WNL  Colonoscopy:  Never BMD:   Never TDaP:  05-11-17 Gardasil: no    reports that she has never smoked. She has never used smokeless tobacco. She reports that she drinks about 1.8 oz of alcohol per week. She reports that she does not use drugs. She works at [employer] in Engineer, mining.   Past Medical History:  Diagnosis Date  . Hives    chronic history    Past Surgical History:  Procedure Laterality Date  . TONSILLECTOMY AND ADENOIDECTOMY  age 39 or 79  . WISDOM TOOTH EXTRACTION      Current Outpatient Medications  Medication Sig Dispense Refill  . cetirizine (ZYRTEC) 10 MG tablet Take 10 mg by mouth daily.    Marland Kitchen desonide (DESOWEN) 0.05 % cream APPLY 1 APPLICATION ON THE SKIN BID PRN  0  . EPIPEN 2-PAK 0.3 MG/0.3ML SOAJ injection Reported on 05/06/2016  1  . ketoconazole (NIZORAL) 2 % shampoo SHAMPOO SCALP QD PRN  2  . Multiple Vitamin (MULTIVITAMIN) tablet Take 1 tablet by mouth daily.    . Norgestimate-Ethinyl Estradiol Triphasic (TRINESSA, 28,) 0.18/0.215/0.25 MG-35 MCG tablet Take 1 tablet by mouth daily. 84 tablet 4   No current facility-administered medications for this visit.     Family History  Problem Relation Age of Onset  . Breast cancer Mother 72       BRCA I/ II negative  . Hyperlipidemia Mother   . Breast cancer Maternal Grandmother 50    Review  of Systems  Constitutional: Negative.   HENT: Negative.   Eyes: Negative.   Respiratory: Negative.   Cardiovascular: Negative.   Gastrointestinal: Negative.   Endocrine: Negative.   Genitourinary: Negative.   Musculoskeletal: Negative.   Skin: Negative.   Allergic/Immunologic: Negative.   Neurological: Negative.   Psychiatric/Behavioral: Negative.     Exam:   BP 118/80 (BP Location: Right Arm, Patient Position: Sitting, Cuff Size: Normal)   Pulse 64   Resp 14   Ht 5' 7.5" (1.715 m)   Wt 151 lb (68.5 kg)   LMP 05/05/2018   BMI 23.30 kg/m   Weight change: _0 @ Height:   Height: 5' 7.5" (171.5 cm)  Ht Readings from Last 3 Encounters:  05/12/18 5' 7.5" (1.715 m)  05/11/17 5' 7.25" (1.708 m)  05/06/16 5' 7.75" (1.721 m)    General appearance: alert, cooperative and appears stated age Head: Normocephalic, without obvious abnormality, atraumatic Neck: no adenopathy, supple, symmetrical, trachea midline and thyroid normal to inspection and palpation Lungs: clear to auscultation bilaterally Cardiovascular: regular rate and rhythm Breasts: normal appearance, no masses or tenderness Abdomen: soft, non-tender; non distended,  no masses,  no organomegaly Extremities: extremities normal, atraumatic, no cyanosis or edema Skin: Skin color, texture, turgor normal. No rashes or lesions Lymph nodes: Cervical, supraclavicular, and axillary nodes normal. No abnormal inguinal nodes  palpated Neurologic: Grossly normal   Pelvic: External genitalia:  no lesions              Urethra:  normal appearing urethra with no masses, tenderness or lesions              Bartholins and Skenes: normal                 Vagina: normal appearing vagina with normal color and discharge, no lesions              Cervix: no lesions               Bimanual Exam:  Uterus:  normal size, contour, position, consistency, mobility, non-tender              Adnexa: no mass, fullness, tenderness                Rectovaginal: Confirms               Anus:  normal sphincter tone, no lesions  Chaperone was present for exam.  A:  Well Woman with normal exam  Family history of breast cancer   P:   Pap with hpv  Mammogram UTD  Discussed breast self exam  Discussed calcium and vit D intake  Continue OCP's  Start gardasil  Screening labs  Tyrer-Cuzick model was done. Patient's lifetime risk of breast cancer is 36.9%. Will set her up for yearly MRI's

## 2018-05-12 NOTE — Patient Instructions (Signed)
EXERCISE AND DIET:  We recommended that you start or continue a regular exercise program for good health. Regular exercise means any activity that makes your heart beat faster and makes you sweat.  We recommend exercising at least 30 minutes per day at least 3 days a week, preferably 4 or 5.  We also recommend a diet low in fat and sugar.  Inactivity, poor dietary choices and obesity can cause diabetes, heart attack, stroke, and kidney damage, among others.    ALCOHOL AND SMOKING:  Women should limit their alcohol intake to no more than 7 drinks/beers/glasses of wine (combined, not each!) per week. Moderation of alcohol intake to this level decreases your risk of breast cancer and liver damage. And of course, no recreational drugs are part of a healthy lifestyle.  And absolutely no smoking or even second hand smoke. Most people know smoking can cause heart and lung diseases, but did you know it also contributes to weakening of your bones? Aging of your skin?  Yellowing of your teeth and nails?  CALCIUM AND VITAMIN D:  Adequate intake of calcium and Vitamin D are recommended.  The recommendations for exact amounts of these supplements seem to change often, but generally speaking 600 mg of calcium (either carbonate or citrate) and 800 units of Vitamin D per day seems prudent. Certain women may benefit from higher intake of Vitamin D.  If you are among these women, your doctor will have told you during your visit.    PAP SMEARS:  Pap smears, to check for cervical cancer or precancers,  have traditionally been done yearly, although recent scientific advances have shown that most women can have pap smears less often.  However, every woman still should have a physical exam from her gynecologist every year. It will include a breast check, inspection of the vulva and vagina to check for abnormal growths or skin changes, a visual exam of the cervix, and then an exam to evaluate the size and shape of the uterus and  ovaries.  And after 40 years of age, a rectal exam is indicated to check for rectal cancers. We will also provide age appropriate advice regarding health maintenance, like when you should have certain vaccines, screening for sexually transmitted diseases, bone density testing, colonoscopy, mammograms, etc.   MAMMOGRAMS:  All women over 40 years old should have a yearly mammogram. Many facilities now offer a "3D" mammogram, which may cost around $50 extra out of pocket. If possible,  we recommend you accept the option to have the 3D mammogram performed.  It both reduces the number of women who will be called back for extra views which then turn out to be normal, and it is better than the routine mammogram at detecting truly abnormal areas.    COLONOSCOPY:  Colonoscopy to screen for colon cancer is recommended for all women at age 50.  We know, you hate the idea of the prep.  We agree, BUT, having colon cancer and not knowing it is worse!!  Colon cancer so often starts as a polyp that can be seen and removed at colonscopy, which can quite literally save your life!  And if your first colonoscopy is normal and you have no family history of colon cancer, most women don't have to have it again for 10 years.  Once every ten years, you can do something that may end up saving your life, right?  We will be happy to help you get it scheduled when you are ready.    Be sure to check your insurance coverage so you understand how much it will cost.  It may be covered as a preventative service at no cost, but you should check your particular policy.      Breast Self-Awareness Breast self-awareness means being familiar with how your breasts look and feel. It involves checking your breasts regularly and reporting any changes to your health care provider. Practicing breast self-awareness is important. A change in your breasts can be a sign of a serious medical problem. Being familiar with how your breasts look and feel allows  you to find any problems early, when treatment is more likely to be successful. All women should practice breast self-awareness, including women who have had breast implants. How to do a breast self-exam One way to learn what is normal for your breasts and whether your breasts are changing is to do a breast self-exam. To do a breast self-exam: Look for Changes  1. Remove all the clothing above your waist. 2. Stand in front of a mirror in a room with good lighting. 3. Put your hands on your hips. 4. Push your hands firmly downward. 5. Compare your breasts in the mirror. Look for differences between them (asymmetry), such as: ? Differences in shape. ? Differences in size. ? Puckers, dips, and bumps in one breast and not the other. 6. Look at each breast for changes in your skin, such as: ? Redness. ? Scaly areas. 7. Look for changes in your nipples, such as: ? Discharge. ? Bleeding. ? Dimpling. ? Redness. ? A change in position. Feel for Changes  Carefully feel your breasts for lumps and changes. It is best to do this while lying on your back on the floor and again while sitting or standing in the shower or tub with soapy water on your skin. Feel each breast in the following way:  Place the arm on the side of the breast you are examining above your head.  Feel your breast with the other hand.  Start in the nipple area and make  inch (2 cm) overlapping circles to feel your breast. Use the pads of your three middle fingers to do this. Apply light pressure, then medium pressure, then firm pressure. The light pressure will allow you to feel the tissue closest to the skin. The medium pressure will allow you to feel the tissue that is a little deeper. The firm pressure will allow you to feel the tissue close to the ribs.  Continue the overlapping circles, moving downward over the breast until you feel your ribs below your breast.  Move one finger-width toward the center of the body.  Continue to use the  inch (2 cm) overlapping circles to feel your breast as you move slowly up toward your collarbone.  Continue the up and down exam using all three pressures until you reach your armpit.  Write Down What You Find  Write down what is normal for each breast and any changes that you find. Keep a written record with breast changes or normal findings for each breast. By writing this information down, you do not need to depend only on memory for size, tenderness, or location. Write down where you are in your menstrual cycle, if you are still menstruating. If you are having trouble noticing differences in your breasts, do not get discouraged. With time you will become more familiar with the variations in your breasts and more comfortable with the exam. How often should I examine my breasts? Examine   your breasts every month. If you are breastfeeding, the best time to examine your breasts is after a feeding or after using a breast pump. If you menstruate, the best time to examine your breasts is 5-7 days after your period is over. During your period, your breasts are lumpier, and it may be more difficult to notice changes. When should I see my health care provider? See your health care provider if you notice:  A change in shape or size of your breasts or nipples.  A change in the skin of your breast or nipples, such as a reddened or scaly area.  Unusual discharge from your nipples.  A lump or thick area that was not there before.  Pain in your breasts.  Anything that concerns you.  This information is not intended to replace advice given to you by your health care provider. Make sure you discuss any questions you have with your health care provider. Document Released: 11/10/2005 Document Revised: 04/17/2016 Document Reviewed: 09/30/2015 Elsevier Interactive Patient Education  2018 Elsevier Inc.  

## 2018-05-13 ENCOUNTER — Telehealth: Payer: Self-pay | Admitting: *Deleted

## 2018-05-13 ENCOUNTER — Other Ambulatory Visit (HOSPITAL_COMMUNITY)
Admission: RE | Admit: 2018-05-13 | Discharge: 2018-05-13 | Disposition: A | Discharge status: Home / Self Care | Payer: BC Managed Care – PPO | Source: Ambulatory Visit | Attending: Obstetrics and Gynecology | Admitting: Obstetrics and Gynecology

## 2018-05-13 DIAGNOSIS — R87611 Atypical squamous cells cannot exclude high grade squamous intraepithelial lesion on cytologic smear of cervix (ASC-H): Secondary | ICD-10-CM | POA: Insufficient documentation

## 2018-05-13 DIAGNOSIS — Z9189 Other specified personal risk factors, not elsewhere classified: Secondary | ICD-10-CM

## 2018-05-13 DIAGNOSIS — Z124 Encounter for screening for malignant neoplasm of cervix: Secondary | ICD-10-CM | POA: Diagnosis not present

## 2018-05-13 DIAGNOSIS — Z1231 Encounter for screening mammogram for malignant neoplasm of breast: Secondary | ICD-10-CM

## 2018-05-13 DIAGNOSIS — Z803 Family history of malignant neoplasm of breast: Secondary | ICD-10-CM

## 2018-05-13 LAB — COMPREHENSIVE METABOLIC PANEL
Albumin: 4.1 g/dL (ref 3.5–5.5)
Alkaline Phosphatase: 51 IU/L (ref 39–117)
BUN/Creatinine Ratio: 14 (ref 9–23)
BUN: 12 mg/dL (ref 6–20)
Bilirubin Total: 0.4 mg/dL (ref 0.0–1.2)
CO2: 23 mmol/L (ref 20–29)
Creatinine, Ser: 0.83 mg/dL (ref 0.57–1.00)
Glucose: 95 mg/dL (ref 65–99)
Total Protein: 6.5 g/dL (ref 6.0–8.5)

## 2018-05-13 LAB — CBC
Hemoglobin: 12.7 g/dL (ref 11.1–15.9)
MCHC: 32.3 g/dL (ref 31.5–35.7)
RBC: 4.33 x10E6/uL (ref 3.77–5.28)

## 2018-05-13 LAB — LIPID PANEL
Cholesterol, Total: 151 mg/dL (ref 100–199)
HDL: 75 mg/dL (ref >39)
LDL Calculated: 65 mg/dL (ref 0–99)
VLDL Cholesterol Cal: 11 mg/dL (ref 5–40)

## 2018-05-13 NOTE — Addendum Note (Signed)
Addended by: Shirlee Whitmire E on: 05/13/2018 08:32 AM   Modules accepted: Orders  

## 2018-05-13 NOTE — Telephone Encounter (Signed)
-----   Message from Romualdo BolkJill Evelyn Jertson, MD sent at 05/12/2018  5:51 PM EDT ----- Please set this patient up for a breast MRI, lifetime risk of breast cancer is 36.9% Thanks, Noreene LarssonJill

## 2018-05-13 NOTE — Telephone Encounter (Signed)
Order placed for bilateral breast MRI with and without contrast.   Call placed to patient, left detailed message, name on voicemail, ok per dpr. Advised [facility] has [facility] Our office will precert MRI and GI will be contacting you directly to schedule. Return call to office if you have any additional questions/concerns.   Routing to provider for final review. Patient is agreeable to disposition. Will close encounter.  Cc: Harland DingwallSuzy Dixon, [place]osa Davis

## 2018-05-17 ENCOUNTER — Telehealth: Payer: Self-pay | Admitting: *Deleted

## 2018-05-17 DIAGNOSIS — R87611 Atypical squamous cells cannot exclude high grade squamous intraepithelial lesion on cytologic smear of cervix (ASC-H): Secondary | ICD-10-CM

## 2018-05-17 LAB — CYTOLOGY - PAP: HPV: NOT DETECTED

## 2018-05-17 NOTE — Telephone Encounter (Signed)
-----   Message from Romualdo BolkJill Evelyn Jertson, MD sent at 05/17/2018  1:10 PM EDT ----- Please inform the patient of her pap and set her up for a colposcopy

## 2018-05-17 NOTE — Telephone Encounter (Signed)
Call to patient. Per DPR can leave message on voice mail which has name confirmation. Left message to call back to triage nurse.  No emergency but calling with test results. Call back at her convenience.

## 2018-05-18 NOTE — Telephone Encounter (Signed)
Patient is returning a call to Sally. °

## 2018-05-18 NOTE — Telephone Encounter (Signed)
Call to patient. Advised of pap results and recommendation for colpo.  Brief review of procedure provided. Instructed to take Motrin 800 mg one hour prior with food. Patient is on OCP andLMP approximately 2 weeks ago. Colpo scheduled for tomorrow at 830.  Encounter closed.

## 2018-05-19 ENCOUNTER — Other Ambulatory Visit: Payer: Self-pay

## 2018-05-19 ENCOUNTER — Encounter: Payer: Self-pay | Admitting: Obstetrics and Gynecology

## 2018-05-19 ENCOUNTER — Ambulatory Visit: Payer: BC Managed Care – PPO | Admitting: Obstetrics and Gynecology

## 2018-05-19 VITALS — BP 122/60 | HR 64 | Resp 14 | Wt 153.0 lb

## 2018-05-19 DIAGNOSIS — R87611 Atypical squamous cells cannot exclude high grade squamous intraepithelial lesion on cytologic smear of cervix (ASC-H): Secondary | ICD-10-CM

## 2018-05-19 DIAGNOSIS — Z01812 Encounter for preprocedural laboratory examination: Secondary | ICD-10-CM

## 2018-05-19 LAB — POCT URINE PREGNANCY: Preg Test, Ur: NEGATIVE

## 2018-05-19 NOTE — Progress Notes (Signed)
GYNECOLOGY  VISIT   HPI: 37 y.o.   Married  Caucasian  female   G0P0000 with Patient's last menstrual period was 05/05/2018.   here for evaluation of an abnormal pap smear. Pap from 05/13/18 returned as ASC-H, negative HPV. No prior abnormal pap.   GYNECOLOGIC HISTORY: Patient's last menstrual period was 05/05/2018. Contraception:OCP Menopausal hormone therapy: none         OB History    Gravida  0   Para  0   Term  0   Preterm  0   AB  0   Living  0     SAB  0   TAB  0   Ectopic  0   Multiple  0   Live Births  0              There are no active problems to display for this patient.   Past Medical History:  Diagnosis Date  . Hives    chronic history    Past Surgical History:  Procedure Laterality Date  . TONSILLECTOMY AND ADENOIDECTOMY  age 1 or 46  . WISDOM TOOTH EXTRACTION      Current Outpatient Medications  Medication Sig Dispense Refill  . cetirizine (ZYRTEC) 10 MG tablet Take 10 mg by mouth daily.    Marland Kitchen desonide (DESOWEN) 0.05 % cream APPLY 1 APPLICATION ON THE SKIN BID PRN  0  . EPIPEN 2-PAK 0.3 MG/0.3ML SOAJ injection Reported on 05/06/2016  1  . ketoconazole (NIZORAL) 2 % shampoo SHAMPOO SCALP QD PRN  2  . Multiple Vitamin (MULTIVITAMIN) tablet Take 1 tablet by mouth daily.    . Norgestimate-Ethinyl Estradiol Triphasic (TRINESSA, 28,) 0.18/0.215/0.25 MG-35 MCG tablet Take 1 tablet by mouth daily. 84 tablet 4   No current facility-administered medications for this visit.      ALLERGIES: Patient has no known allergies.  Family History  Problem Relation Age of Onset  . Breast cancer Mother 57       BRCA I/ II negative  . Hyperlipidemia Mother   . Breast cancer Maternal Grandmother 23    Social History   Socioeconomic History  . Marital status: Married    Spouse name: Not on file  . Number of children: Not on file  . Years of education: Not on file  . Highest education level: Not on file  Occupational History  . Not on file   Social Needs  . Financial resource strain: Not on file  . Food insecurity:    Worry: Not on file    Inability: Not on file  . Transportation needs:    Medical: Not on file    Non-medical: Not on file  Tobacco Use  . Smoking status: Never Smoker  . Smokeless tobacco: Never Used  Substance and Sexual Activity  . Alcohol use: Yes    Alcohol/week: 1.8 oz    Types: 3 Cans of beer per week  . Drug use: No  . Sexual activity: Yes    Partners: Male    Birth control/protection: Pill  Lifestyle  . Physical activity:    Days per week: Not on file    Minutes per session: Not on file  . Stress: Not on file  Relationships  . Social connections:    Talks on phone: Not on file    Gets together: Not on file    Attends religious service: Not on file    Active member of club or organization: Not on file    Attends meetings  of clubs or organizations: Not on file    Relationship status: Not on file  . Intimate partner violence:    Fear of current or ex partner: Not on file    Emotionally abused: Not on file    Physically abused: Not on file    Forced sexual activity: Not on file  Other Topics Concern  . Not on file  Social History Narrative  . Not on file    Review of Systems  Constitutional: Negative.   HENT: Negative.   Eyes: Negative.   Respiratory: Negative.   Cardiovascular: Negative.   Gastrointestinal: Negative.   Genitourinary: Negative.   Musculoskeletal: Negative.   Skin: Negative.   Neurological: Negative.   Endo/Heme/Allergies: Negative.   Psychiatric/Behavioral: Negative.     PHYSICAL EXAMINATION:    BP 122/60 (BP Location: Right Arm, Patient Position: Sitting, Cuff Size: Normal)   Pulse 64   Resp 14   Wt 153 lb (69.4 kg)   LMP 05/05/2018   BMI 23.61 kg/m     General appearance: alert, cooperative and appears stated age   Pelvic: External genitalia:  no lesions              Urethra:  normal appearing urethra with no masses, tenderness or lesions               Bartholins and Skenes: normal                 Vagina: normal appearing vagina with normal color and discharge, no lesions              Cervix: no lesions  Colposcopy: unsatisfactory, no aceto-white changes, negative lugols of the cervix and upper vagina. Endocervical curettage done.   Chaperone was present for exam.  ASSESSMENT ASC-H pap with negative HPV testing.    PLAN Reviewed abnormal pap, discussed that it is possible to have a strain of hpv that wasn't checked for Discussed the possible need for treatment, reviewed LEEP procedure Colposcopy with ECC done   An After Visit Summary was printed and given to the patient.

## 2018-05-19 NOTE — Patient Instructions (Signed)

## 2018-05-20 NOTE — Addendum Note (Signed)
Addended by: Tobi BastosJERTSON, JILL E on: 05/20/2018 04:23 PM   Modules accepted: Orders

## 2018-05-26 ENCOUNTER — Telehealth: Payer: Self-pay | Admitting: Obstetrics and Gynecology

## 2018-05-26 NOTE — Telephone Encounter (Signed)
Call to patient. Results reviewed with patient as seen below from Dr. Oscar LaJertson. Patient verbalized understanding. Patient states she would like to have pap repeated in 12 months. Joanna Terry is scheduled for 05-18-19. 08 recall placed for 06/20.   Routing to provider for final review. Patient agreeable to disposition. Will close encounter.

## 2018-05-26 NOTE — Telephone Encounter (Signed)
Patient requesting lab results from last week. 

## 2018-05-26 NOTE — Telephone Encounter (Signed)
-----   Message from Romualdo BolkJill Evelyn Jertson, MD sent at 05/26/2018  1:25 PM EDT ----- Please let the patient know that her ECC returned as normal. She had an ASC-H pap with negative hpv testing and an unsatisfactory colposcopy. She has 2 options: one is a leep, the other is pap with hpv testing in 12 and 24 months with a repeat colposcopy with any abnormality.

## 2018-06-11 ENCOUNTER — Ambulatory Visit
Admission: RE | Admit: 2018-06-11 | Discharge: 2018-06-11 | Disposition: A | Discharge status: Home / Self Care | Payer: BC Managed Care – PPO | Source: Ambulatory Visit | Attending: Obstetrics and Gynecology | Admitting: Obstetrics and Gynecology

## 2018-06-11 DIAGNOSIS — Z9189 Other specified personal risk factors, not elsewhere classified: Secondary | ICD-10-CM

## 2018-06-11 DIAGNOSIS — Z803 Family history of malignant neoplasm of breast: Secondary | ICD-10-CM

## 2018-06-11 MED ORDER — GADOBENATE DIMEGLUMINE 529 MG/ML IV SOLN
14.0000 mL | Freq: Once | INTRAVENOUS | Status: AC | PRN
  Administered 2018-06-11: 14 mL via INTRAVENOUS

## 2018-07-12 ENCOUNTER — Other Ambulatory Visit: Payer: Self-pay

## 2018-07-12 ENCOUNTER — Ambulatory Visit (INDEPENDENT_AMBULATORY_CARE_PROVIDER_SITE_OTHER): Payer: BC Managed Care – PPO | Admitting: *Deleted

## 2018-07-12 VITALS — BP 102/60 | HR 72 | Resp 14 | Ht 68.0 in | Wt 149.2 lb

## 2018-07-12 DIAGNOSIS — Z23 Encounter for immunization: Secondary | ICD-10-CM | POA: Diagnosis not present

## 2018-07-12 NOTE — Progress Notes (Signed)
Patient in today for second Gardasil injection.   Contraception: OCP  LMP: 06-30-18 Last AEX: 05-12-18 with Dr. Oscar LaJertson   Injection given in right deltoid. Patient tolerated shot well.   Patient informed next injection due in about four months. (after 11-11-18)  Advised patient, if not on birth control, to return for next injection with cycle.   Routed to provider for final review.  Encounter closed.

## 2018-11-11 ENCOUNTER — Ambulatory Visit (INDEPENDENT_AMBULATORY_CARE_PROVIDER_SITE_OTHER): Payer: BC Managed Care – PPO

## 2018-11-11 VITALS — BP 110/64 | HR 66 | Resp 14 | Ht 67.0 in | Wt 142.0 lb

## 2018-11-11 DIAGNOSIS — Z23 Encounter for immunization: Secondary | ICD-10-CM

## 2018-11-11 NOTE — Progress Notes (Signed)
Patient in today for 3rd Gardasil injection.   Contraception: OCP LMP: 10/19/18 Last AEX: 05/12/18 with Dr. Oscar LaJertson  Injection given in left deltoid. Patient tolerated shot well.   Patient is complete with HPV vaccination series.  Advised patient, if not on birth control, to return for next injection with cycle.   Routed to provider for final review.  Encounter closed.

## 2019-05-18 ENCOUNTER — Encounter: Payer: Self-pay | Admitting: Obstetrics and Gynecology

## 2019-05-18 ENCOUNTER — Other Ambulatory Visit (HOSPITAL_COMMUNITY)
Admission: RE | Admit: 2019-05-18 | Discharge: 2019-05-18 | Disposition: A | Discharge status: Home / Self Care | Payer: BC Managed Care – PPO | Source: Ambulatory Visit | Attending: Obstetrics and Gynecology | Admitting: Obstetrics and Gynecology

## 2019-05-18 ENCOUNTER — Other Ambulatory Visit: Payer: Self-pay

## 2019-05-18 ENCOUNTER — Ambulatory Visit: Payer: BC Managed Care – PPO | Admitting: Obstetrics and Gynecology

## 2019-05-18 VITALS — BP 108/68 | HR 84 | Temp 98.1°F | Ht 67.32 in | Wt 146.0 lb

## 2019-05-18 DIAGNOSIS — Z01419 Encounter for gynecological examination (general) (routine) without abnormal findings: Secondary | ICD-10-CM | POA: Diagnosis not present

## 2019-05-18 DIAGNOSIS — Z0000 Encounter for general adult medical examination without abnormal findings: Secondary | ICD-10-CM

## 2019-05-18 DIAGNOSIS — Z124 Encounter for screening for malignant neoplasm of cervix: Secondary | ICD-10-CM

## 2019-05-18 DIAGNOSIS — Z9189 Other specified personal risk factors, not elsewhere classified: Secondary | ICD-10-CM

## 2019-05-18 DIAGNOSIS — Z803 Family history of malignant neoplasm of breast: Secondary | ICD-10-CM | POA: Diagnosis not present

## 2019-05-18 MED ORDER — NORGESTIM-ETH ESTRAD TRIPHASIC 0.18/0.215/0.25 MG-35 MCG PO TABS: 1.0000 | ORAL_TABLET | Freq: Every day | ORAL | 4 refills | Status: DC

## 2019-05-18 NOTE — Progress Notes (Signed)
38 y.o. G0P0000 Legally Separated White or Caucasian Not Hispanic or Latino female here for annual exam.   H/O ASC-H pap with negative hpv testing, unsatisfactory colposcopy and negative ECC. The patient was offered leep or close f/u, chose close f/u. She is in the process of a divorce. Not sexually active. No STD concerns. She has an elevated risk of breast cancer. She had a breast MRI last year (normal), she didn't tolerate the MRI well. She had a syncopal event with the IV.   Period Cycle (Days): 28 Period Duration (Days): 4-5 days Period Pattern: Regular Menstrual Flow: Moderate Menstrual Control: Tampon Menstrual Control Change Freq (Hours): changes tampon every 4-6 hours Dysmenorrhea: (!) Mild Dysmenorrhea Symptoms: Cramping  Patient's last menstrual period was 05/04/2019 (exact date).          Sexually active: No The current method of family planning is OCP (estrogen/progesterone).    Exercising: Yes.    running, strength training Smoker:  no  Health Maintenance: Pap:  05/13/2018 ASC-H NEG HPV, 05-02-15 WNL NEG HR HPV  History of abnormal Pap:  yes, colpo 05/20/2018 unsatisfactory, ECC normal MMG: 09/04/2017 Birads 1 negative, 06/11/2018 Breast MRI WNL Colonoscopy:  Never BMD:   Never TDaP:  05-11-17 Gardasil: yes x 3    reports that she has never smoked. She has never used smokeless tobacco. She reports current alcohol use of about 2.0 - 4.0 standard drinks of alcohol per week. She reports that she does not use drugs. 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    Past Medical History:  Diagnosis Date  . Hives    chronic history    Past Surgical History:  Procedure Laterality Date  . TONSILLECTOMY AND ADENOIDECTOMY  age 6 or 7  . WISDOM TOOTH EXTRACTION      Current Outpatient Medications  Medication Sig Dispense Refill  . cetirizine (ZYRTEC) 10 MG tablet Take 10 mg by mouth daily.    . desonide (DESOWEN) 0.05 % cream APPLY 1 APPLICATION ON THE SKIN BID PRN  0  . EPIPEN 2-PAK 0.3  MG/0.3ML SOAJ injection Reported on 05/06/2016  1  . ketoconazole (NIZORAL) 2 % shampoo SHAMPOO SCALP QD PRN  2  . Norgestimate-Ethinyl Estradiol Triphasic (TRINESSA, 28,) 0.18/0.215/0.25 MG-35 MCG tablet Take 1 tablet by mouth daily. 84 tablet 4   No current facility-administered medications for this visit.     Family History  Problem Relation Age of Onset  . Breast cancer Mother 58       BRCA I/ II negative  . Hyperlipidemia Mother   . Breast cancer Maternal Grandmother 50    Review of Systems  Constitutional: Negative.   HENT: Negative.   Eyes: Negative.   Respiratory: Negative.   Cardiovascular: Negative.   Gastrointestinal: Negative.   Endocrine: Negative.   Genitourinary: Negative.   Musculoskeletal: Negative.   Skin: Negative.   Allergic/Immunologic: Negative.   Neurological: Negative.   Hematological: Negative.   Psychiatric/Behavioral: Negative.     Exam:   BP 108/68 (BP Location: Right Arm, Patient Position: Sitting, Cuff Size: Normal)   Pulse 84   Temp 98.1 F (36.7 C) (Skin)   Ht 5' 7.32" (1.71 m)   Wt 146 lb (66.2 kg)   LMP 05/04/2019 (Exact Date)   BMI 22.65 kg/m   Weight change: @WEIGHTCHANGE@ Height:   Height: 5' 7.32" (171 cm)  Ht Readings from Last 3 Encounters:  05/18/19 5' 7.32" (1.71 m)  11/11/18 5' 7" (1.702 m)  07/12/18 5' 8" (1.727 m)      General appearance: alert, cooperative and appears stated age Head: Normocephalic, without obvious abnormality, atraumatic Neck: no adenopathy, supple, symmetrical, trachea midline and thyroid normal to inspection and palpation Lungs: clear to auscultation bilaterally Cardiovascular: regular rate and rhythm Breasts: normal appearance, no masses or tenderness Abdomen: soft, non-tender; non distended,  no masses,  no organomegaly Extremities: extremities normal, atraumatic, no cyanosis or edema Skin: Skin color, texture, turgor normal. No rashes or lesions Lymph nodes: Cervical, supraclavicular, and  axillary nodes normal. No abnormal inguinal nodes palpated Neurologic: Grossly normal   Pelvic: External genitalia:  no lesions              Urethra:  normal appearing urethra with no masses, tenderness or lesions              Bartholins and Skenes: normal                 Vagina: normal appearing vagina with normal color and discharge, no lesions              Cervix: no lesions               Bimanual Exam:  Uterus:  normal size, contour, position, consistency, mobility, non-tender              Adnexa: no mass, fullness, tenderness               Rectovaginal: Confirms               Anus:  normal sphincter tone, no lesions  Chaperone was present for exam.  A:  Well Woman with normal exam  FH of breast cancer  Elevated risk of breast cancer, 36.9%  Discussed contraception, aware OCP's could potentially increase her risk of breast cancer   H/O abnormal pap  P:   Mammogram overdue, she will schedule  Breast MRI, due next month, she declines this year. Understand recommendations  Discussed breast self exam  Discussed calcium and vit D intake  Pap with hpv  Screening labs       

## 2019-05-18 NOTE — Patient Instructions (Signed)

## 2019-05-19 ENCOUNTER — Other Ambulatory Visit: Payer: Self-pay | Admitting: Obstetrics and Gynecology

## 2019-05-19 DIAGNOSIS — Z1231 Encounter for screening mammogram for malignant neoplasm of breast: Secondary | ICD-10-CM

## 2019-05-19 LAB — COMPREHENSIVE METABOLIC PANEL
ALT: 12 IU/L (ref 0–32)
AST: 21 IU/L (ref 0–40)
Albumin/Globulin Ratio: 2 (ref 1.2–2.2)
Albumin: 4.5 g/dL (ref 3.8–4.8)
Alkaline Phosphatase: 54 IU/L (ref 39–117)
BUN/Creatinine Ratio: 8 — ABNORMAL LOW (ref 9–23)
BUN: 7 mg/dL (ref 6–20)
Bilirubin Total: 0.3 mg/dL (ref 0.0–1.2)
CO2: 23 mmol/L (ref 20–29)
Calcium: 9.1 mg/dL (ref 8.7–10.2)
Chloride: 100 mmol/L (ref 96–106)
Creatinine, Ser: 0.92 mg/dL (ref 0.57–1.00)
GFR calc Af Amer: 91 mL/min/{1.73_m2} (ref >59)
GFR calc non Af Amer: 79 mL/min/{1.73_m2} (ref >59)
Globulin, Total: 2.3 g/dL (ref 1.5–4.5)
Glucose: 84 mg/dL (ref 65–99)
Potassium: 4.3 mmol/L (ref 3.5–5.2)
Sodium: 138 mmol/L (ref 134–144)
Total Protein: 6.8 g/dL (ref 6.0–8.5)

## 2019-05-19 LAB — CBC
Hematocrit: 42.8 % (ref 34.0–46.6)
Hemoglobin: 14 g/dL (ref 11.1–15.9)
MCH: 30.2 pg (ref 26.6–33.0)
MCHC: 32.7 g/dL (ref 31.5–35.7)
MCV: 92 fL (ref 79–97)
Platelets: 251 10*3/uL (ref 150–450)
RBC: 4.63 x10E6/uL (ref 3.77–5.28)
RDW: 11.6 % — ABNORMAL LOW (ref 11.7–15.4)
WBC: 6.7 10*3/uL (ref 3.4–10.8)

## 2019-05-19 LAB — LIPID PANEL
Chol/HDL Ratio: 2.1 ratio (ref 0.0–4.4)
Cholesterol, Total: 177 mg/dL (ref 100–199)
HDL: 84 mg/dL (ref >39)
LDL Calculated: 70 mg/dL (ref 0–99)
Triglycerides: 117 mg/dL (ref 0–149)
VLDL Cholesterol Cal: 23 mg/dL (ref 5–40)

## 2019-05-20 LAB — CYTOLOGY - PAP
Adequacy: ABSENT
Diagnosis: NEGATIVE
HPV: NOT DETECTED

## 2019-05-31 ENCOUNTER — Other Ambulatory Visit: Payer: Self-pay | Admitting: *Deleted

## 2019-05-31 DIAGNOSIS — Z20822 Contact with and (suspected) exposure to covid-19: Secondary | ICD-10-CM

## 2019-06-05 LAB — NOVEL CORONAVIRUS, NAA: SARS-CoV-2, NAA: NOT DETECTED

## 2019-06-30 ENCOUNTER — Ambulatory Visit
Admission: RE | Admit: 2019-06-30 | Discharge: 2019-06-30 | Disposition: A | Discharge status: Home / Self Care | Payer: BC Managed Care – PPO | Source: Ambulatory Visit | Attending: Obstetrics and Gynecology | Admitting: Obstetrics and Gynecology

## 2019-06-30 ENCOUNTER — Other Ambulatory Visit: Payer: Self-pay

## 2019-06-30 DIAGNOSIS — Z1231 Encounter for screening mammogram for malignant neoplasm of breast: Secondary | ICD-10-CM

## 2020-02-02 ENCOUNTER — Ambulatory Visit: Payer: BC Managed Care – PPO

## 2020-05-18 ENCOUNTER — Other Ambulatory Visit: Payer: Self-pay

## 2020-05-21 ENCOUNTER — Ambulatory Visit: Payer: BC Managed Care – PPO | Admitting: Obstetrics and Gynecology

## 2020-05-21 ENCOUNTER — Other Ambulatory Visit (HOSPITAL_COMMUNITY)
Admission: RE | Admit: 2020-05-21 | Discharge: 2020-05-21 | Disposition: A | Discharge status: Home / Self Care | Payer: BC Managed Care – PPO | Source: Ambulatory Visit | Attending: Obstetrics and Gynecology | Admitting: Obstetrics and Gynecology

## 2020-05-21 ENCOUNTER — Encounter: Payer: Self-pay | Admitting: Obstetrics and Gynecology

## 2020-05-21 ENCOUNTER — Other Ambulatory Visit: Payer: Self-pay

## 2020-05-21 VITALS — BP 100/60 | HR 88 | Resp 12 | Ht 68.25 in | Wt 144.0 lb

## 2020-05-21 DIAGNOSIS — Z3041 Encounter for surveillance of contraceptive pills: Secondary | ICD-10-CM

## 2020-05-21 DIAGNOSIS — Z01419 Encounter for gynecological examination (general) (routine) without abnormal findings: Secondary | ICD-10-CM | POA: Diagnosis not present

## 2020-05-21 DIAGNOSIS — Z0000 Encounter for general adult medical examination without abnormal findings: Secondary | ICD-10-CM

## 2020-05-21 DIAGNOSIS — Z124 Encounter for screening for malignant neoplasm of cervix: Secondary | ICD-10-CM | POA: Diagnosis present

## 2020-05-21 MED ORDER — NORGESTIM-ETH ESTRAD TRIPHASIC 0.18/0.215/0.25 MG-35 MCG PO TABS: 1.0000 | ORAL_TABLET | Freq: Every day | ORAL | 4 refills | Status: DC

## 2020-05-21 NOTE — Progress Notes (Signed)
39 y.o. G0P0000 Divorced White or Caucasian Not Hispanic or Latino female here for annual exam.  Divorced last year, considering dating again.  Period Cycle (Days): 28 Period Duration (Days): 4-5 Period Pattern: Regular Menstrual Flow: Light Menstrual Control: Tampon Menstrual Control Change Freq (Hours): 4-8 Dysmenorrhea: (!) Mild Dysmenorrhea Symptoms: Cramping, Headache   TC risk of breast cancer is 36.9%  Patient's last menstrual period was 05/01/2020.          Sexually active: No.  The current method of family planning is OCP (estrogen/progesterone).    Exercising: Yes.    running and strength training Smoker:  no  Health Maintenance: Pap:   05/18/19 Neg:Neg HR HPV  05/13/2018 ASC-H NEG HPV  05-02-15 WNL NEG HR HPV History of abnormal Pap:  Yes, colpo 05/20/18 unsatisfactory, ECC normal  MMG:  06/30/19 BIRADS 1 negative/density c Last breast MRI was in 7/19, negative. The patient didn't tolerate it well.  TDaP:  05/11/17  Gardasil: Completed   reports that she has never smoked. She has never used smokeless tobacco. She reports current alcohol use of about 2.0 - 4.0 standard drinks of alcohol per week. She reports that she does not use drugs. She works at [employer] in Engineer, mining.  Past Medical History:  Diagnosis Date  . Hives    chronic history    Past Surgical History:  Procedure Laterality Date  . TONSILLECTOMY AND ADENOIDECTOMY  age 79 or 12  . WISDOM TOOTH EXTRACTION      Current Outpatient Medications  Medication Sig Dispense Refill  . cetirizine (ZYRTEC) 10 MG tablet Take 10 mg by mouth daily.    Marland Kitchen desonide (DESOWEN) 0.05 % cream APPLY 1 APPLICATION ON THE SKIN BID PRN  0  . EPIPEN 2-PAK 0.3 MG/0.3ML SOAJ injection Reported on 05/06/2016  1  . ketoconazole (NIZORAL) 2 % shampoo SHAMPOO SCALP QD PRN  2  . Norgestimate-Ethinyl Estradiol Triphasic (TRINESSA, 28,) 0.18/0.215/0.25 MG-35 MCG tablet Take 1 tablet by mouth daily. 84 tablet 4   No current facility-administered  medications for this visit.    Family History  Problem Relation Age of Onset  . Breast cancer Mother 29       BRCA I/ II negative  . Hyperlipidemia Mother   . Breast cancer Maternal Grandmother 50    Review of Systems  Constitutional: Negative.   HENT: Negative.   Eyes: Negative.   Respiratory: Negative.   Cardiovascular: Negative.   Gastrointestinal: Negative.   Endocrine: Negative.   Genitourinary: Negative.   Musculoskeletal: Negative.   Skin: Negative.   Allergic/Immunologic: Negative.   Neurological: Negative.   Hematological: Negative.   Psychiatric/Behavioral: Negative.     Exam:   BP 100/60 (BP Location: Right Arm, Patient Position: Sitting, Cuff Size: Normal)   Pulse 88   Resp 12   Ht 5' 8.25" (1.734 m)   Wt 144 lb (65.3 kg)   LMP 05/01/2020   BMI 21.74 kg/m   Weight change: _0 @ Height:   Height: 5' 8.25" (173.4 cm)  Ht Readings from Last 3 Encounters:  05/21/20 5' 8.25" (1.734 m)  05/18/19 5' 7.32" (1.71 m)  11/11/18 _1  (1.702 m)    General appearance: alert, cooperative and appears stated age Head: Normocephalic, without obvious abnormality, atraumatic Neck: no adenopathy, supple, symmetrical, trachea midline and thyroid normal to inspection and palpation Lungs: clear to auscultation bilaterally Cardiovascular: regular rate and rhythm Breasts: normal appearance, no masses or tenderness Abdomen: soft, non-tender; non distended,  no masses,  no organomegaly Extremities:  extremities normal, atraumatic, no cyanosis or edema Skin: Skin color, texture, turgor normal. No rashes or lesions Lymph nodes: Cervical, supraclavicular, and axillary nodes normal. No abnormal inguinal nodes palpated Neurologic: Grossly normal   Pelvic: External genitalia:  no lesions              Urethra:  normal appearing urethra with no masses, tenderness or lesions              Bartholins and Skenes: normal                 Vagina: normal appearing vagina with  normal color and discharge, no lesions              Cervix: no lesions               Bimanual Exam:  Uterus:  normal size, contour, position, consistency, mobility, non-tender, anteverted              Adnexa: no mass, fullness, tenderness               Rectovaginal: Confirms               Anus:  normal sphincter tone, no lesions  Terence Lux chaperoned for the exam.  A:  Well Woman with normal exam  Risk of breast cancer is 36.9%  Not currently sexually active, but would like to stay on OCP's  P:   Pap with hpv  Mammogram in 8/21, will schedule MRI 6 months after that  We discussed coming off of OCP's, she wants to stay on it for now (aware of risk of breast cancer)  Use condoms if sexually active.   Information given on the paragard, kyleena and mirena IUD's  Discussed breast self exam  Discussed calcium and vit D intake  Screening labs

## 2020-05-21 NOTE — Patient Instructions (Signed)
EXERCISE AND DIET:  We recommended that you start or continue a regular exercise program for good health. Regular exercise means any activity that makes your heart beat faster and makes you sweat.  We recommend exercising at least 30 minutes per day at least 3 days a week, preferably 4 or 5.  We also recommend a diet low in fat and sugar.  Inactivity, poor dietary choices and obesity can cause diabetes, heart attack, stroke, and kidney damage, among others.    ALCOHOL AND SMOKING:  Women should limit their alcohol intake to no more than 7 drinks/beers/glasses of wine (combined, not each!) per week. Moderation of alcohol intake to this level decreases your risk of breast cancer and liver damage. And of course, no recreational drugs are part of a healthy lifestyle.  And absolutely no smoking or even second hand smoke. Most people know smoking can cause heart and lung diseases, but did you know it also contributes to weakening of your bones? Aging of your skin?  Yellowing of your teeth and nails?  CALCIUM AND VITAMIN D:  Adequate intake of calcium and Vitamin D are recommended.  The recommendations for exact amounts of these supplements seem to change often, but generally speaking 1,200 mg of calcium (between diet and supplement) and 800 units of Vitamin D per day seems prudent. Certain women may benefit from higher intake of Vitamin D.  If you are among these women, your doctor will have told you during your visit.    PAP SMEARS:  Pap smears, to check for cervical cancer or precancers,  have traditionally been done yearly, although recent scientific advances have shown that most women can have pap smears less often.  However, every woman still should have a physical exam from her gynecologist every year. It will include a breast check, inspection of the vulva and vagina to check for abnormal growths or skin changes, a visual exam of the cervix, and then an exam to evaluate the size and shape of the uterus and  ovaries.  And after 40 years of age, a rectal exam is indicated to check for rectal cancers. We will also provide age appropriate advice regarding health maintenance, like when you should have certain vaccines, screening for sexually transmitted diseases, bone density testing, colonoscopy, mammograms, etc.   MAMMOGRAMS:  All women over 40 years old should have a yearly mammogram. Many facilities now offer a "3D" mammogram, which may cost around $50 extra out of pocket. If possible,  we recommend you accept the option to have the 3D mammogram performed.  It both reduces the number of women who will be called back for extra views which then turn out to be normal, and it is better than the routine mammogram at detecting truly abnormal areas.    COLON CANCER SCREENING: Now recommend starting at age 45. At this time colonoscopy is not covered for routine screening until 50. There are take home tests that can be done between 45-49.   COLONOSCOPY:  Colonoscopy to screen for colon cancer is recommended for all women at age 50.  We know, you hate the idea of the prep.  We agree, BUT, having colon cancer and not knowing it is worse!!  Colon cancer so often starts as a polyp that can be seen and removed at colonscopy, which can quite literally save your life!  And if your first colonoscopy is normal and you have no family history of colon cancer, most women don't have to have it again for   10 years.  Once every ten years, you can do something that may end up saving your life, right?  We will be happy to help you get it scheduled when you are ready.  Be sure to check your insurance coverage so you understand how much it will cost.  It may be covered as a preventative service at no cost, but you should check your particular policy.      Breast Self-Awareness Breast self-awareness means being familiar with how your breasts look and feel. It involves checking your breasts regularly and reporting any changes to your  health care provider. Practicing breast self-awareness is important. A change in your breasts can be a sign of a serious medical problem. Being familiar with how your breasts look and feel allows you to find any problems early, when treatment is more likely to be successful. All women should practice breast self-awareness, including women who have had breast implants. How to do a breast self-exam One way to learn what is normal for your breasts and whether your breasts are changing is to do a breast self-exam. To do a breast self-exam: Look for Changes  1. Remove all the clothing above your waist. 2. Stand in front of a mirror in a room with good lighting. 3. Put your hands on your hips. 4. Push your hands firmly downward. 5. Compare your breasts in the mirror. Look for differences between them (asymmetry), such as: ? Differences in shape. ? Differences in size. ? Puckers, dips, and bumps in one breast and not the other. 6. Look at each breast for changes in your skin, such as: ? Redness. ? Scaly areas. 7. Look for changes in your nipples, such as: ? Discharge. ? Bleeding. ? Dimpling. ? Redness. ? A change in position. Feel for Changes Carefully feel your breasts for lumps and changes. It is best to do this while lying on your back on the floor and again while sitting or standing in the shower or tub with soapy water on your skin. Feel each breast in the following way:  Place the arm on the side of the breast you are examining above your head.  Feel your breast with the other hand.  Start in the nipple area and make  inch (2 cm) overlapping circles to feel your breast. Use the pads of your three middle fingers to do this. Apply light pressure, then medium pressure, then firm pressure. The light pressure will allow you to feel the tissue closest to the skin. The medium pressure will allow you to feel the tissue that is a little deeper. The firm pressure will allow you to feel the tissue  close to the ribs.  Continue the overlapping circles, moving downward over the breast until you feel your ribs below your breast.  Move one finger-width toward the center of the body. Continue to use the  inch (2 cm) overlapping circles to feel your breast as you move slowly up toward your collarbone.  Continue the up and down exam using all three pressures until you reach your armpit.  Write Down What You Find  Write down what is normal for each breast and any changes that you find. Keep a written record with breast changes or normal findings for each breast. By writing this information down, you do not need to depend only on memory for size, tenderness, or location. Write down where you are in your menstrual cycle, if you are still menstruating. If you are having trouble noticing differences   in your breasts, do not get discouraged. With time you will become more familiar with the variations in your breasts and more comfortable with the exam. How often should I examine my breasts? Examine your breasts every month. If you are breastfeeding, the best time to examine your breasts is after a feeding or after using a breast pump. If you menstruate, the best time to examine your breasts is 5-7 days after your period is over. During your period, your breasts are lumpier, and it may be more difficult to notice changes. When should I see my health care provider? See your health care provider if you notice:  A change in shape or size of your breasts or nipples.  A change in the skin of your breast or nipples, such as a reddened or scaly area.  Unusual discharge from your nipples.  A lump or thick area that was not there before.  Pain in your breasts.  Anything that concerns you.  

## 2020-05-22 ENCOUNTER — Telehealth: Payer: Self-pay

## 2020-05-22 DIAGNOSIS — Z803 Family history of malignant neoplasm of breast: Secondary | ICD-10-CM

## 2020-05-22 DIAGNOSIS — Z9189 Other specified personal risk factors, not elsewhere classified: Secondary | ICD-10-CM

## 2020-05-22 LAB — COMPREHENSIVE METABOLIC PANEL
ALT: 12 IU/L (ref 0–32)
AST: 16 IU/L (ref 0–40)
Albumin/Globulin Ratio: 1.6 (ref 1.2–2.2)
Albumin: 4.2 g/dL (ref 3.8–4.8)
Alkaline Phosphatase: 50 IU/L (ref 48–121)
BUN/Creatinine Ratio: 13 (ref 9–23)
BUN: 12 mg/dL (ref 6–24)
Bilirubin Total: 0.3 mg/dL (ref 0.0–1.2)
CO2: 24 mmol/L (ref 20–29)
Calcium: 9 mg/dL (ref 8.7–10.2)
Chloride: 105 mmol/L (ref 96–106)
Creatinine, Ser: 0.92 mg/dL (ref 0.57–1.00)
GFR calc Af Amer: 90 mL/min/{1.73_m2} (ref >59)
GFR calc non Af Amer: 78 mL/min/{1.73_m2} (ref >59)
Globulin, Total: 2.6 g/dL (ref 1.5–4.5)
Glucose: 82 mg/dL (ref 65–99)
Potassium: 4.3 mmol/L (ref 3.5–5.2)
Sodium: 140 mmol/L (ref 134–144)
Total Protein: 6.8 g/dL (ref 6.0–8.5)

## 2020-05-22 LAB — CBC
Hematocrit: 41.2 % (ref 34.0–46.6)
Hemoglobin: 13.2 g/dL (ref 11.1–15.9)
MCH: 29.3 pg (ref 26.6–33.0)
MCHC: 32 g/dL (ref 31.5–35.7)
MCV: 91 fL (ref 79–97)
Platelets: 245 10*3/uL (ref 150–450)
RBC: 4.51 x10E6/uL (ref 3.77–5.28)
RDW: 11.6 % — ABNORMAL LOW (ref 11.7–15.4)
WBC: 7.1 10*3/uL (ref 3.4–10.8)

## 2020-05-22 LAB — CYTOLOGY - PAP
Comment: NEGATIVE
Diagnosis: NEGATIVE
High risk HPV: NEGATIVE

## 2020-05-22 LAB — LIPID PANEL
Chol/HDL Ratio: 2.1 ratio (ref 0.0–4.4)
Cholesterol, Total: 169 mg/dL (ref 100–199)
HDL: 82 mg/dL (ref >39)
LDL Chol Calc (NIH): 73 mg/dL (ref 0–99)
Triglycerides: 77 mg/dL (ref 0–149)
VLDL Cholesterol Cal: 14 mg/dL (ref 5–40)

## 2020-05-22 NOTE — Telephone Encounter (Signed)
Left pt detailed message per Dr Oscar La.  Pt made aware of Breast MRI orders placed for 12/2020 at [facility]. Will return call to pt once breast MRI appt made.   Routing to Dr Oscar La for review.  Encounter closed.  Cc: Hedda Slade for precert.  Orders placed.

## 2020-05-22 NOTE — Telephone Encounter (Signed)
-----   Message from Romualdo Bolk, MD sent at 05/21/2020  3:03 PM EDT ----- Patient at high risk of breast cancer. Needs Breast MRI. Please schedule in 2/22, 6 months after her mammogram.  Thanks, Noreene Larsson

## 2020-05-23 ENCOUNTER — Other Ambulatory Visit: Payer: Self-pay | Admitting: Obstetrics and Gynecology

## 2020-05-23 DIAGNOSIS — Z1231 Encounter for screening mammogram for malignant neoplasm of breast: Secondary | ICD-10-CM

## 2020-07-04 ENCOUNTER — Ambulatory Visit
Admission: RE | Admit: 2020-07-04 | Discharge: 2020-07-04 | Disposition: A | Discharge status: Home / Self Care | Payer: BC Managed Care – PPO | Source: Ambulatory Visit

## 2020-07-04 ENCOUNTER — Other Ambulatory Visit: Payer: Self-pay

## 2020-07-04 DIAGNOSIS — Z1231 Encounter for screening mammogram for malignant neoplasm of breast: Secondary | ICD-10-CM

## 2020-10-15 ENCOUNTER — Ambulatory Visit: Payer: BC Managed Care – PPO | Admitting: Obstetrics and Gynecology

## 2020-10-15 ENCOUNTER — Encounter: Payer: Self-pay | Admitting: Obstetrics and Gynecology

## 2020-10-15 ENCOUNTER — Other Ambulatory Visit: Payer: Self-pay

## 2020-10-15 VITALS — BP 138/74 | HR 68 | Temp 98.2°F | Ht 68.0 in | Wt 149.0 lb

## 2020-10-15 DIAGNOSIS — N941 Unspecified dyspareunia: Secondary | ICD-10-CM | POA: Diagnosis not present

## 2020-10-15 DIAGNOSIS — R3989 Other symptoms and signs involving the genitourinary system: Secondary | ICD-10-CM

## 2020-10-15 DIAGNOSIS — R350 Frequency of micturition: Secondary | ICD-10-CM | POA: Diagnosis not present

## 2020-10-15 LAB — POCT URINALYSIS DIPSTICK
Bilirubin, UA: NEGATIVE
Blood, UA: NEGATIVE
Glucose, UA: NEGATIVE
Ketones, UA: NEGATIVE
Leukocytes, UA: NEGATIVE
Nitrite, UA: NEGATIVE
Protein, UA: NEGATIVE
Urobilinogen, UA: 0.2 E.U./dL
pH, UA: 7 (ref 5.0–8.0)

## 2020-10-15 NOTE — Progress Notes (Signed)
GYNECOLOGY  VISIT   HPI: 39 y.o.   Divorced   Unavailable White or Caucasian Unavailable  female   [place] with Patient's last menstrual period was 09/19/2020.   here for urinary pressure and frequency. She states that she had a sex for the first time in a while last weekend and it was painful, she didn't use a lubricant. She has had symptoms since.   Bladder symptoms have been intermittent for the last week. Bladder pressure, urinary frequency, some urgency. Voiding normal amounts, no dysuria. No fever, no back pain.  No current vaginal c/o.     GYNECOLOGIC HISTORY: Patient's last menstrual period was 09/19/2020. Contraception: OCP Menopausal hormone therapy:  None         OB History    Gravida  0   Para  0   Term  0   Preterm  0   AB  0   Living  0     SAB  0   TAB  0   Ectopic  0   Multiple  0   Live Births  0              There are no problems to display for this patient.   Past Medical History:  Diagnosis Date  . Hives    chronic history    Past Surgical History:  Procedure Laterality Date  . TONSILLECTOMY AND ADENOIDECTOMY  age 17 or 74  . WISDOM TOOTH EXTRACTION      Current Outpatient Medications  Medication Sig Dispense Refill  . cetirizine (ZYRTEC) 10 MG tablet Take 10 mg by mouth daily.    Marland Kitchen EPIPEN 2-PAK 0.3 MG/0.3ML SOAJ injection Reported on 05/06/2016  1  . ketoconazole (NIZORAL) 2 % shampoo SHAMPOO SCALP QD PRN  2  . Norgestimate-Ethinyl Estradiol Triphasic (TRINESSA, 28,) 0.18/0.215/0.25 MG-35 MCG tablet Take 1 tablet by mouth daily. 84 tablet 4   No current facility-administered medications for this visit.     ALLERGIES: Patient has no known allergies.  Family History  Problem Relation Age of Onset  . Breast cancer Mother 45       BRCA I/ II negative  . Hyperlipidemia Mother   . Breast cancer Maternal Grandmother 5    Social History   Socioeconomic History  . Marital status: Divorced    Spouse name: Not on file  .  Number of children: Not on file  . Years of education: Not on file  . Highest education level: Not on file  Occupational History  . Not on file  Tobacco Use  . Smoking status: Never Smoker  . Smokeless tobacco: Never Used  Vaping Use  . Vaping Use: Never used  Substance and Sexual Activity  . Alcohol use: Yes    Alcohol/week: 2.0 - 4.0 standard drinks    Types: 2 - 4 Standard drinks or equivalent per week  . Drug use: No  . Sexual activity: Not Currently    Partners: Male    Birth control/protection: Pill  Other Topics Concern  . Not on file  Social History Narrative  . Not on file   Social Determinants of Health   Financial Resource Strain:   . Difficulty of Paying Living Expenses: Not on file  Food Insecurity:   . Worried About Charity fundraiser in the Last Year: Not on file  . Ran Out of Food in the Last Year: Not on file  Transportation Needs:   . Lack of Transportation (Medical): Not on file  .  Lack of Transportation (Non-Medical): Not on file  Physical Activity:   . Days of Exercise per Week: Not on file  . Minutes of Exercise per Session: Not on file  Stress:   . Feeling of Stress : Not on file  Social Connections:   . Frequency of Communication with Friends and Family: Not on file  . Frequency of Social Gatherings with Friends and Family: Not on file  . Attends Religious Services: Not on file  . Active Member of Clubs or Organizations: Not on file  . Attends Archivist Meetings: Not on file  . Marital Status: Not on file  Intimate Partner Violence:   . Fear of Current or Ex-Partner: Not on file  . Emotionally Abused: Not on file  . Physically Abused: Not on file  . Sexually Abused: Not on file    Review of Systems  Genitourinary: Positive for frequency.  All other systems reviewed and are negative.   PHYSICAL EXAMINATION:    BP 138/74   Pulse 68   Temp 98.2 F (36.8 C)   Ht _0  (1.727 m)   Wt 149 lb (67.6 kg)   LMP 09/19/2020    SpO2 99%   BMI 22.66 kg/m     General appearance: alert, cooperative and appears stated age Abdomen: soft, non-tender; non distended, no masses,  no organomegaly CVA: not tender  ASSESSMENT Urinary frequency and urgency Dyspareunia, first time she was sexually active in a few years    PLAN Urine dip negative Send urine for ua, c&s She will call with worsening symptoms Hydrate well Use lubrication with intercourse

## 2020-10-16 LAB — URINALYSIS, MICROSCOPIC ONLY
Bacteria, UA: NONE SEEN
Casts: NONE SEEN /lpf
RBC, Urine: NONE SEEN /hpf (ref 0–2)
WBC, UA: NONE SEEN /hpf (ref 0–5)

## 2020-10-17 LAB — URINE CULTURE: Organism ID, Bacteria: NO GROWTH

## 2021-01-21 ENCOUNTER — Ambulatory Visit
Admission: RE | Admit: 2021-01-21 | Discharge: 2021-01-21 | Disposition: A | Discharge status: Home / Self Care | Payer: BC Managed Care – PPO | Source: Ambulatory Visit | Attending: Obstetrics and Gynecology | Admitting: Obstetrics and Gynecology

## 2021-01-21 ENCOUNTER — Other Ambulatory Visit: Payer: Self-pay

## 2021-01-21 DIAGNOSIS — Z803 Family history of malignant neoplasm of breast: Secondary | ICD-10-CM

## 2021-01-21 DIAGNOSIS — Z9189 Other specified personal risk factors, not elsewhere classified: Secondary | ICD-10-CM

## 2021-01-21 MED ORDER — GADOBUTROL 1 MMOL/ML IV SOLN
6.0000 mL | Freq: Once | INTRAVENOUS | Status: AC | PRN
  Administered 2021-01-21: 6 mL via INTRAVENOUS

## 2021-01-22 DIAGNOSIS — M25561 Pain in right knee: Secondary | ICD-10-CM

## 2021-01-22 HISTORY — DX: Pain in right knee: M25.561

## 2021-01-23 DIAGNOSIS — M222X1 Patellofemoral disorders, right knee: Secondary | ICD-10-CM | POA: Insufficient documentation

## 2021-05-22 ENCOUNTER — Other Ambulatory Visit: Payer: Self-pay

## 2021-05-22 ENCOUNTER — Encounter: Payer: Self-pay | Admitting: Obstetrics and Gynecology

## 2021-05-22 ENCOUNTER — Ambulatory Visit (INDEPENDENT_AMBULATORY_CARE_PROVIDER_SITE_OTHER): Payer: BC Managed Care – PPO | Admitting: Obstetrics and Gynecology

## 2021-05-22 VITALS — BP 110/74 | HR 72 | Ht 67.5 in | Wt 150.0 lb

## 2021-05-22 DIAGNOSIS — J301 Allergic rhinitis due to pollen: Secondary | ICD-10-CM | POA: Insufficient documentation

## 2021-05-22 DIAGNOSIS — J309 Allergic rhinitis, unspecified: Secondary | ICD-10-CM

## 2021-05-22 DIAGNOSIS — Z803 Family history of malignant neoplasm of breast: Secondary | ICD-10-CM

## 2021-05-22 DIAGNOSIS — H1045 Other chronic allergic conjunctivitis: Secondary | ICD-10-CM

## 2021-05-22 DIAGNOSIS — L500 Allergic urticaria: Secondary | ICD-10-CM | POA: Insufficient documentation

## 2021-05-22 DIAGNOSIS — Z9189 Other specified personal risk factors, not elsewhere classified: Secondary | ICD-10-CM | POA: Diagnosis not present

## 2021-05-22 DIAGNOSIS — Z0000 Encounter for general adult medical examination without abnormal findings: Secondary | ICD-10-CM

## 2021-05-22 DIAGNOSIS — L209 Atopic dermatitis, unspecified: Secondary | ICD-10-CM | POA: Insufficient documentation

## 2021-05-22 DIAGNOSIS — Z3009 Encounter for other general counseling and advice on contraception: Secondary | ICD-10-CM | POA: Diagnosis not present

## 2021-05-22 DIAGNOSIS — J3081 Allergic rhinitis due to animal (cat) (dog) hair and dander: Secondary | ICD-10-CM | POA: Insufficient documentation

## 2021-05-22 DIAGNOSIS — Z113 Encounter for screening for infections with a predominantly sexual mode of transmission: Secondary | ICD-10-CM

## 2021-05-22 DIAGNOSIS — Z01419 Encounter for gynecological examination (general) (routine) without abnormal findings: Secondary | ICD-10-CM | POA: Diagnosis not present

## 2021-05-22 HISTORY — DX: Other chronic allergic conjunctivitis: H10.45

## 2021-05-22 HISTORY — DX: Allergic rhinitis, unspecified: J30.9

## 2021-05-22 NOTE — Patient Instructions (Signed)

## 2021-05-22 NOTE — Progress Notes (Signed)
40 y.o. [id number] Divorced   Unavailable White or Caucasian Unavailable female here for annual exam.  Patient states that some times she has some brown spotting in between her periods. She is on OCP's, one episode of 10 days of light spotting, may have missed one pill then doubled up.  Period Cycle (Days): 27 Period Duration (Days): 4-6 Period Pattern: Regular Menstrual Flow: Moderate, Light Menstrual Control: Tampon Menstrual Control Change Freq (Hours): 3-4 Dysmenorrhea: (!) Mild Dysmenorrhea Symptoms: Cramping, Headache  She has been sexually active in the last year, not currently.   Ty[place]-Cuzick risk of breast cancer is 36.9%  Patient's last menstrual period was 04/29/2021.          Sexually active: No.  The current method of family planning is OCP (estrogen/progesterone).    Exercising: Yes.     Running and strength traing  Smoker:  no  Health Maintenance: Pap:    05/21/20 Neg, neg HR HPV  05/18/19 Neg:Neg HR HPV             05/13/2018 ASC-H NEG HPV             05-02-15 WNL NEG HR HPV  History of abnormal Pap:  yes, colpo 05/20/18 unsatisfactory, ECC normal  MMG:  07/04/20 Bi-rads 1 neg  Breast MRI: 01/21/21 negative.  BMD:   none Colonoscopy: 05/19/18  TDaP:  05/11/2017  Gardasil: Complete    reports that she has never smoked. She has never used smokeless tobacco. She reports current alcohol use of about 2.0 - 4.0 standard drinks of alcohol per week. She reports that she does not use drugs. She works at [employer] in Engineer, mining.   Past Medical History:  Diagnosis Date   Hives    chronic history    Past Surgical History:  Procedure Laterality Date   TONSILLECTOMY AND ADENOIDECTOMY  age 68 or 53   WISDOM TOOTH EXTRACTION      Current Outpatient Medications  Medication Sig Dispense Refill   cetirizine (ZYRTEC) 10 MG tablet Take 10 mg by mouth daily.     EPIPEN 2-PAK 0.3 MG/0.3ML SOAJ injection Reported on 05/06/2016  1   ketoconazole (NIZORAL) 2 % shampoo SHAMPOO SCALP QD PRN  2    Norgestimate-Ethinyl Estradiol Triphasic (TRINESSA, 28,) 0.18/0.215/0.25 MG-35 MCG tablet Take 1 tablet by mouth daily. 84 tablet 4   No current facility-administered medications for this visit.    Family History  Problem Relation Age of Onset   Breast cancer Mother 47       BRCA I/ II negative   Hyperlipidemia Mother    Prostate cancer Father 52   Breast cancer Maternal Grandmother 75    Review of Systems  All other systems reviewed and are negative.  Exam:   BP 110/74   Pulse 72   Ht 5' 7.5" (1.715 m)   Wt 150 lb (68 kg)   LMP 04/29/2021   SpO2 98%   BMI 23.15 kg/m   Weight change: _0 @ Height:   Height: 5' 7.5" (171.5 cm)  Ht Readings from Last 3 Encounters:  05/22/21 5' 7.5" (1.715 m)  10/15/20 _1  (1.727 m)  05/21/20 5' 8.25" (1.734 m)    General appearance: alert, cooperative and appears stated age Head: Normocephalic, without obvious abnormality, atraumatic Neck: no adenopathy, supple, symmetrical, trachea midline and thyroid normal to inspection and palpation Lungs: clear to auscultation bilaterally Cardiovascular: regular rate and rhythm Breasts: normal appearance, no masses or tenderness Abdomen: soft, non-tender; non distended,  no masses,  no  organomegaly Extremities: extremities normal, atraumatic, no cyanosis or edema Skin: Skin color, texture, turgor normal. No rashes or lesions Lymph nodes: Cervical, supraclavicular, and axillary nodes normal. No abnormal inguinal nodes palpated Neurologic: Grossly normal   Pelvic: External genitalia:  no lesions              Urethra:  normal appearing urethra with no masses, tenderness or lesions              Bartholins and Skenes: normal                 Vagina: normal appearing vagina with normal color and discharge, no lesions              Cervix: no lesions               Bimanual Exam:  Uterus:  normal size, contour, position, consistency, mobility, non-tender              Adnexa: no mass, fullness,  tenderness               Rectovaginal: Confirms               Anus:  normal sphincter tone, no lesions  Gae Dry chaperoned for the exam.  1. Well woman exam Discussed breast self exam Discussed calcium and vit D intake   2. Family history of breast cancer  3. At high risk for breast cancer Mammogram in 8/22, MRI UTD She is considering doing the MRI every 2 years, has a hard time with injection of contrast.   4. General counseling and advice on female contraception We discussed contraception. She is not currently sexually active, has increased risk for breast cancer. Under stands the risks of OCP's. She will call if she wants to restart pills, if so will use a 20 mcg pill  5. Screening examination for STD (sexually transmitted disease) - RPR - HIV Antibody (routine testing w rflx) - Hepatitis C antibody - HSV(herpes simplex vrs) 1+2 ab-IgG - SURESWAB CT/NG/T. vaginalis  6. Laboratory exam ordered as part of routine general medical examination - CBC - Comprehensive metabolic panel - Lipid panel

## 2021-05-23 ENCOUNTER — Encounter: Payer: Self-pay | Admitting: Obstetrics and Gynecology

## 2021-05-23 LAB — CBC
HCT: 43.5 % (ref 35.0–45.0)
Hemoglobin: 13.8 g/dL (ref 11.7–15.5)
MCH: 29 pg (ref 27.0–33.0)
MCHC: 31.7 g/dL — ABNORMAL LOW (ref 32.0–36.0)
MCV: 91.4 fL (ref 80.0–100.0)
MPV: 11.3 fL (ref 7.5–12.5)
Platelets: 262 10*3/uL (ref 140–400)
RBC: 4.76 10*6/uL (ref 3.80–5.10)
RDW: 11.5 % (ref 11.0–15.0)
WBC: 7.4 10*3/uL (ref 3.8–10.8)

## 2021-05-23 LAB — HSV(HERPES SIMPLEX VRS) I + II AB-IGG
HSV 1 IGG,TYPE SPECIFIC AB: <0.9 {index}
HSV 2 IGG,TYPE SPECIFIC AB: <0.9 {index}

## 2021-05-23 LAB — COMPREHENSIVE METABOLIC PANEL WITH GFR
AG Ratio: 1.6 (calc) (ref 1.0–2.5)
ALT: 12 U/L (ref 6–29)
AST: 18 U/L (ref 10–30)
Albumin: 4.4 g/dL (ref 3.6–5.1)
Alkaline phosphatase (APISO): 46 U/L (ref 31–125)
BUN: 20 mg/dL (ref 7–25)
CO2: 22 mmol/L (ref 20–32)
Calcium: 8.9 mg/dL (ref 8.6–10.2)
Chloride: 103 mmol/L (ref 98–110)
Creat: 0.89 mg/dL (ref 0.50–1.10)
Globulin: 2.7 g/dL (ref 1.9–3.7)
Glucose, Bld: 84 mg/dL (ref 65–99)
Potassium: 4.5 mmol/L (ref 3.5–5.3)
Sodium: 137 mmol/L (ref 135–146)
Total Bilirubin: 0.4 mg/dL (ref 0.2–1.2)
Total Protein: 7.1 g/dL (ref 6.1–8.1)

## 2021-05-23 LAB — LIPID PANEL
Cholesterol: 170 mg/dL
HDL: 80 mg/dL
LDL Cholesterol (Calc): 72 mg/dL
Non-HDL Cholesterol (Calc): 90 mg/dL
Total CHOL/HDL Ratio: 2.1 (calc)
Triglycerides: 99 mg/dL

## 2021-05-23 LAB — SURESWAB CT/NG/T. VAGINALIS
C. trachomatis RNA, TMA: NOT DETECTED
N. gonorrhoeae RNA, TMA: NOT DETECTED
Trichomonas vaginalis RNA: NOT DETECTED

## 2021-05-23 LAB — SYPHILIS: RPR W/REFLEX TO RPR TITER AND TREPONEMAL ANTIBODIES, TRADITIONAL SCREENING AND DIAGNOSIS ALGORITHM: RPR Ser Ql: NONREACTIVE

## 2021-05-23 LAB — HEPATITIS C ANTIBODY
Hepatitis C Ab: NONREACTIVE
SIGNAL TO CUT-OFF: 0.01 (ref <1.00)

## 2021-05-23 LAB — HIV ANTIBODY (ROUTINE TESTING W REFLEX): HIV 1&2 Ab, 4th Generation: NONREACTIVE

## 2021-07-12 ENCOUNTER — Other Ambulatory Visit: Payer: Self-pay | Admitting: Obstetrics and Gynecology

## 2021-07-12 DIAGNOSIS — Z1231 Encounter for screening mammogram for malignant neoplasm of breast: Secondary | ICD-10-CM

## 2021-08-01 ENCOUNTER — Other Ambulatory Visit: Payer: Self-pay

## 2021-08-01 ENCOUNTER — Ambulatory Visit
Admission: RE | Admit: 2021-08-01 | Discharge: 2021-08-01 | Disposition: A | Discharge status: Home / Self Care | Payer: BC Managed Care – PPO | Source: Ambulatory Visit | Attending: Obstetrics and Gynecology | Admitting: Obstetrics and Gynecology

## 2021-08-01 DIAGNOSIS — Z1231 Encounter for screening mammogram for malignant neoplasm of breast: Secondary | ICD-10-CM

## 2021-10-28 ENCOUNTER — Encounter (HOSPITAL_BASED_OUTPATIENT_CLINIC_OR_DEPARTMENT_OTHER): Payer: Self-pay | Admitting: Nurse Practitioner

## 2021-10-28 ENCOUNTER — Ambulatory Visit (HOSPITAL_BASED_OUTPATIENT_CLINIC_OR_DEPARTMENT_OTHER): Payer: BC Managed Care – PPO | Admitting: Nurse Practitioner

## 2021-10-28 ENCOUNTER — Other Ambulatory Visit: Payer: Self-pay

## 2021-10-28 VITALS — BP 117/72 | HR 99 | Ht 68.0 in | Wt 149.3 lb

## 2021-10-28 DIAGNOSIS — F411 Generalized anxiety disorder: Secondary | ICD-10-CM

## 2021-10-28 DIAGNOSIS — R499 Unspecified voice and resonance disorder: Secondary | ICD-10-CM

## 2021-10-28 DIAGNOSIS — Z0000 Encounter for general adult medical examination without abnormal findings: Secondary | ICD-10-CM

## 2021-10-28 MED ORDER — HYDROXYZINE HCL 25 MG PO TABS: ORAL_TABLET | ORAL | 3 refills | Status: DC

## 2021-10-28 MED ORDER — SERTRALINE HCL 50 MG PO TABS: ORAL_TABLET | ORAL | 3 refills | Status: DC

## 2021-10-28 NOTE — Patient Instructions (Signed)
Thank you for choosing [place] at [facility] for your Primary Care needs. I am excited for the opportunity to partner with you to meet your health care goals. It was a pleasure meeting you today!  Recommendations from today's visit: I have sent in hydroxyzine for sleep and sertraline for anxiety. We will plan to check how you are feeling in about 4 weeks, unless you need to discuss with me sooner. We can do this with a phone call.  I would like to check your thyroid today- it does not appear it has been checked and this can be the cause of the scratchy throat/voice changes and increased anxiety. It is worth just making sure this isn't a cause.    Information on diet, exercise, and health maintenance recommendations are listed below. This is information to help you be sure you are on track for optimal health and monitoring.   Please look over this and let us know if you have any questions or if you have completed any of the health maintenance outside of [place] so that we can be sure your records are up to date.  ___________________________________________________________ About Me: I am an Adult-Geriatric Nurse Practitioner with a background in caring for patients for more than 20 years with a strong intensive care background. I provide primary care and sports medicine services to patients age 81 and older within this office. My education had a strong focus on caring for the older adult population, which I am passionate about. I am also the director of the APP Fellowship with [facility].   My desire is to provide you with the best service through preventive medicine and supportive care. I consider you a part of the medical team and value your input. I work diligently to ensure that you are heard and your needs are met in a safe and effective manner. I want you to feel comfortable with me as your provider and want you to know that your health concerns are important to  me.  For your information, our office hours are: Monday, Tuesday, and Thursday 8:00 AM - 5:00 PM Wednesday and Friday 8:00 AM - 12:00 PM.   In my time away from the office I am teaching new APP's within the system and am unavailable, but my partner, Dr. Burnard Bunting is in the office for emergent needs.   If you have questions or concerns, please call our office at [phone] or send [place] a MyChart message and we will respond as quickly as possible.  ____________________________________________________________ MyChart:  For all urgent or time sensitive needs we ask that you please call the office to avoid delays. Our number is [phone]. MyChart is not constantly monitored and due to the large volume of messages a day, replies may take up to 72 business hours.  MyChart Policy: MyChart allows for you to see your visit notes, after visit summary, provider recommendations, lab and tests results, make an appointment, request refills, and contact your provider or the office for non-urgent questions or concerns. Providers are seeing patients during normal business hours and do not have built in time to review MyChart messages.  We ask that you allow a minimum of 3 business days for responses to [employer]. For this reason, please do not send urgent requests through [place]. Please call the office at [phone]. New and ongoing conditions may require a visit. We have virtual and in person visit available for your convenience.  Complex MyChart concerns may require a visit.  Your provider may request you schedule a virtual or in person visit to ensure we are providing the best care possible. MyChart messages sent after 11:00 AM on Friday will not be received by the provider until Monday morning.    Lab and Test Results: You will receive your lab and test results on MyChart as soon as they are completed and results have been sent by the lab or testing facility. Due to this service, you will receive  your results BEFORE your provider.  I review lab and tests results each morning prior to seeing patients. Some results require collaboration with other providers to ensure you are receiving the most appropriate care. For this reason, we ask that you please allow a minimum of 3-5 business days from the time the ALL results have been received for your provider to receive and review lab and test results and contact you about these.  Most lab and test result comments from the provider will be sent through [place]. Your provider may recommend changes to the plan of care, follow-up visits, repeat testing, ask questions, or request an office visit to discuss these results. You may reply directly to this message or call the office at [phone] to provide information for the provider or set up an appointment. In some instances, you will be called with test results and recommendations. Please let us know if this is preferred and we will make note of this in your chart to provide this for you.    If you have not heard a response to your lab or test results in 5 business days from all results returning to [place], please call the office to let us know. We ask that you please avoid calling prior to this time unless there is an emergent concern. Due to high call volumes, this can delay the resulting process.  After Hours: For all non-emergency after hours needs, please call the office at [phone] and select the option to reach the on-call provider service. On-call services are shared between multiple [place] offices and therefore it will not be possible to speak directly with your provider. On-call providers may provide medical advice and recommendations, but are unable to provide refills for maintenance medications.  For all emergency or urgent medical needs after normal business hours, we recommend that you seek care at the closest Urgent Care or Emergency Department to ensure appropriate treatment in a  timely manner.  [facility] has a 24 hour emergency room located on the ground floor for your convenience.   Urgent Concerns During the Business Day Providers are seeing patients from 8AM to [place] with a busy schedule and are most often not able to respond to non-urgent calls until the end of the day or the next business day. If you should have URGENT concerns during the day, please call and speak to the nurse or schedule a same day appointment so that we can address your concern without delay.   Thank you, again, for choosing me as your health care partner. I appreciate your trust and look forward to learning more about you.   Joanna Keeler, DNP, AGNP-c ___________________________________________________________  Health Maintenance Recommendations Screening Testing Mammogram Every 1 -2 years based on history and risk factors Starting at age 21 Pap Smear Ages 21-39 every 3 years Ages 60-65 every 5 years with HPV testing More frequent testing may be required based on results and history Colon Cancer Screening Every 1-10 years based on test performed, risk factors, and history  Starting at age 51 Bone Density Screening Every 2-10 years based on history Starting at age 29 for women Recommendations for men differ based on medication usage, history, and risk factors AAA Screening One time ultrasound Men 83-5 years old who have every smoked Lung Cancer Screening Low Dose Lung CT every 12 months Age 15-80 years with a 30 pack-year smoking history who still smoke or who have quit within the last 15 years  Screening Labs Routine  Labs: Complete Blood Count (CBC), Complete Metabolic Panel (CMP), Cholesterol (Lipid Panel) Every 6-12 months based on history and medications May be recommended more frequently based on current conditions or previous results Hemoglobin A1c Lab Every 3-12 months based on history and previous results Starting at age 57 or earlier with  diagnosis of diabetes, high cholesterol, BMI >26, and/or risk factors Frequent monitoring for patients with diabetes to ensure blood sugar control Thyroid Panel (TSH w/ T3 & T4) Every 6 months based on history, symptoms, and risk factors May be repeated more often if on medication HIV One time testing for all patients 53 and older May be repeated more frequently for patients with increased risk factors or exposure Hepatitis C One time testing for all patients 87 and older May be repeated more frequently for patients with increased risk factors or exposure Gonorrhea, Chlamydia Every 12 months for all sexually active persons 13-24 years Additional monitoring may be recommended for those who are considered high risk or who have symptoms PSA Men 28-63 years old with risk factors Additional screening may be recommended from age 67-69 based on risk factors, symptoms, and history  Vaccine Recommendations Tetanus Booster All adults every 10 years Flu Vaccine All patients 6 months and older every year COVID Vaccine All patients 12 years and older Initial dosing with booster May recommend additional booster based on age and health history HPV Vaccine 2 doses all patients age 51-26 Dosing may be considered for patients over 26 Shingles Vaccine (Shingrix) 2 doses all adults 35 years and older Pneumonia (Pneumovax 23) All adults 35 years and older May recommend earlier dosing based on health history Pneumonia (Prevnar 42) All adults 52 years and older Dosed 1 year after Pneumovax 23  Additional Screening, Testing, and Vaccinations may be recommended on an individualized basis based on family history, health history, risk factors, and/or exposure.  __________________________________________________________  Diet Recommendations for All Patients  I recommend that all patients maintain a diet low in saturated fats, carbohydrates, and cholesterol. While this can be challenging at first, it  is not impossible and small changes can make big differences.  Things to try: Decreasing the amount of soda, sweet tea, and/or juice to one or less per day and replace with water While water is always the first choice, if you do not like water you may consider adding a water additive without sugar to improve the taste other sugar free drinks Replace potatoes with a brightly colored vegetable at dinner Use healthy oils, such as canola oil or olive oil, instead of butter or hard margarine Limit your bread intake to two pieces or less a day Replace regular pasta with low carb pasta options Bake, broil, or grill foods instead of frying Monitor portion sizes  Eat smaller, more frequent meals throughout the day instead of large meals  An important thing to remember is, if you love foods that are not great for your health, you don't have to give them up completely. Instead, allow these foods to be a reward when you  have done well. Allowing yourself to still have special treats every once in a while is a nice way to tell yourself thank you for working hard to keep yourself healthy.   Also remember that every day is a new day. If you have a bad day and "fall off the wagon", you can still climb right back up and keep moving along on your journey!  We have resources available to help you!  Some websites that may be helpful include: www.http://carter.biz/  Www.VeryWellFit.com _____________________________________________________________  Activity Recommendations for All Patients  I recommend that all adults get at least 20 minutes of moderate physical activity that elevates your heart rate at least 5 days out of the week.  Some examples include: Walking or jogging at a pace that allows you to carry on a conversation Cycling (stationary bike or outdoors) Water aerobics Yoga Weight lifting Dancing If physical limitations prevent you from putting stress on your joints, exercise in a pool or seated in a  chair are excellent options.  Do determine your MAXIMUM heart rate for activity: YOUR AGE - 220 = MAX HeartRate   Remember! Do not push yourself too hard.  Start slowly and build up your pace, speed, weight, time in exercise, etc.  Allow your body to rest between exercise and get good sleep. You will need more water than normal when you are exerting yourself. Do not wait until you are thirsty to drink. Drink with a purpose of getting in at least 8, 8 ounce glasses of water a day plus more depending on how much you exercise and sweat.    If you begin to develop dizziness, chest pain, abdominal pain, jaw pain, shortness of breath, headache, vision changes, lightheadedness, or other concerning symptoms, stop the activity and allow your body to rest. If your symptoms are severe, seek emergency evaluation immediately. If your symptoms are concerning, but not severe, please let us know so that we can recommend further evaluation.

## 2021-10-28 NOTE — Progress Notes (Signed)
Tollie Eth, DNP, AGNP-c [facility] [address]  [address] [place], [place] [address] [phone]  New patient visit   Patient: Joanna Terry   DOB: Jun 22, 1981   40 y.o. Female  MRN: [id number] Visit Date: 10/28/2021  Patient Care Team: Wisdom Rickey, Sung Amabile, NP as PCP - General (Nurse Practitioner) Romualdo Bolk, MD as Consulting Physician (Obstetrics and Gynecology)  Today's healthcare provider: Tollie Eth, NP   Chief Complaint  Patient presents with   New Patient (Initial Visit)    Patient is here to establish care. She would like to discuss ongoing anxiety issues. This started 3 years ago after a divorce. Patient is in therapy, her and the therapist feel that she needs something to daily and she would like to discuss that today. She takes no medications daily.    Subjective    HPI  Joanna Terry is a 40 y.o. female who presents today as a new patient to establish care.   Joanna Terry endorses ongoing anxiety issues that have been present since about 2019. She reports a divorce from her husband shortly before the end of the year initially contributing to her symptoms followed by the COVID-19 pandemic, which has contributed.  She is currently in counseling and feels that is very helpful for her.  She is unsure if this has been an issue in the past as she was on hydroxyzine from childhood until her 29's for chronic hives and feels that this may have helped to mask her symptoms.  She feels that medication would be helpful in her management of anxiety at this time.   She also endorses a scratchy, weak voice for the past several months.  This is new and not associated with any known concerns.  She does endorse allergies, but is unsure if this could be the cause.  She did have lab work recently, but thyroid levels were not checked at that time.   She is currently unmarried and lives with her dog. 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  She endorses an overall healthy diet and physical activity with running at least 3 times a weight and light weight training.  She feels safe in her current home and relationships and has a good support system.  She denies recreational drug use or nicotine use and only drinks alcohol in moderation socially.   Past Medical History:  Diagnosis Date   Allergic rhinitis 05/22/2021   Hives    chronic history   Pain in joint of right knee 01/22/2021   Past Surgical History:  Procedure Laterality Date   TONSILLECTOMY AND ADENOIDECTOMY  age 40 or 66   WISDOM TOOTH EXTRACTION     Family Status  Relation Name Status   Mother  Alive   Father  Alive   Sister  Alive   Brother  Alive   MGM  Deceased at age 46's   MGF  Deceased   PGM  Deceased   PGF  Deceased   Family History  Problem Relation Age of Onset   Breast cancer Mother 66       BRCA I/ II negative   Hyperlipidemia Mother    Prostate cancer Father 52   Breast cancer Maternal Grandmother 107   Social History   Socioeconomic History   Marital status: Divorced    Spouse name: Not on file   Number of children: Not on file   Years of education: Not on file  Highest education level: Not on file  Occupational History   Not on file  Tobacco Use   Smoking status: Never   Smokeless tobacco: Never  Vaping Use   Vaping Use: Never used  Substance and Sexual Activity   Alcohol use: Yes    Alcohol/week: 2.0 - 4.0 standard drinks    Types: 2 - 4 Standard drinks or equivalent per week   Drug use: No   Sexual activity: Not Currently    Partners: Male    Birth control/protection: Pill  Other Topics Concern   Not on file  Social History Narrative   Not on file   Social Determinants of Health   Financial Resource Strain: Not on file  Food Insecurity: Not on file  Transportation Needs: Not on file  Physical Activity: Not on file  Stress: Not on file  Social Connections: Not on file   Outpatient Medications Prior to Visit   Medication Sig   cetirizine (ZYRTEC) 10 MG tablet Take 10 mg by mouth daily.   EPIPEN 2-PAK 0.3 MG/0.3ML SOAJ injection Reported on 05/06/2016   [DISCONTINUED] ketoconazole (NIZORAL) 2 % shampoo SHAMPOO SCALP QD PRN (Patient not taking: Reported on 10/28/2021)   [DISCONTINUED] Norgestimate-Ethinyl Estradiol Triphasic (TRINESSA, 28,) 0.18/0.215/0.25 MG-35 MCG tablet Take 1 tablet by mouth daily.   No facility-administered medications prior to visit.   No Known Allergies  Immunization History  Administered Date(s) Administered   HPV 9-valent 05/12/2018, 07/12/2018, 11/11/2018   Influenza, High Dose Seasonal PF 03/08/2020   Influenza,inj,Quad PF,6+ Mos 09/21/2018   Influenza-Unspecified 09/08/2019, 08/29/2020, 09/18/2021   Tdap 01/14/2008, 05/11/2017   Unspecified SARS-COV-2 Vaccination 03/08/2020    Health Maintenance  Topic Date Due   COVID-19 Vaccine (2 - Mixed Product series) 04/05/2020   PAP SMEAR-Modifier  05/22/2023   TETANUS/TDAP  05/12/2027   INFLUENZA VACCINE  Completed   Hepatitis C Screening  Completed   HIV Screening  Completed   Pneumococcal Vaccine 74-57 Years old  Aged Out   HPV VACCINES  Aged Out    Patient Care Team: River Ambrosio, Coralee Pesa, NP as PCP - General (Nurse Practitioner) Salvadore Dom, MD as Consulting Physician (Obstetrics and Gynecology)  Review of Systems All review of systems negative except what is listed in the HPI   Objective    BP 117/72   Pulse 99   Ht $R'5\' 8"'oT$  (1.727 m)   Wt 149 lb 4.8 oz (67.7 kg)   SpO2 99%   BMI 22.70 kg/m  Physical Exam Vitals and nursing note reviewed.  Constitutional:      General: She is not in acute distress.    Appearance: Normal appearance.  Eyes:     Extraocular Movements: Extraocular movements intact.     Conjunctiva/sclera: Conjunctivae normal.     Pupils: Pupils are equal, round, and reactive to light.  Neck:     Vascular: No carotid bruit.  Cardiovascular:     Rate and Rhythm: Normal rate and  regular rhythm.     Pulses: Normal pulses.     Heart sounds: Normal heart sounds. No murmur heard. Pulmonary:     Effort: Pulmonary effort is normal.     Breath sounds: Normal breath sounds. No wheezing.  Abdominal:     General: Bowel sounds are normal.     Palpations: Abdomen is soft.  Musculoskeletal:        General: Normal range of motion.     Cervical back: Normal range of motion.     Right lower leg: No  edema.     Left lower leg: No edema.  Skin:    General: Skin is warm and dry.     Capillary Refill: Capillary refill takes less than 2 seconds.  Neurological:     General: No focal deficit present.     Mental Status: She is alert and oriented to person, place, and time.  Psychiatric:        Mood and Affect: Mood normal.        Behavior: Behavior normal.        Thought Content: Thought content normal.        Judgment: Judgment normal.    Depression Screen PHQ 2/9 Scores 10/29/2021  PHQ - 2 Score 1  PHQ- 9 Score 5   Results for orders placed or performed in visit on 10/28/21  T4  Result Value Ref Range   T4, Total 6.6 4.5 - 12.0 ug/dL  T3  Result Value Ref Range   T3, Total 97 71 - 180 ng/dL  TSH  Result Value Ref Range   TSH 1.150 0.450 - 4.500 uIU/mL    Assessment & Plan      Problem List Items Addressed This Visit     Generalized anxiety disorder - Primary    Symptoms and presentation consistent with GAD likely exacerbated with stressors related to divorce and pandemic occurring at the same time.  Recommend trial of sertraline daily to help with anxiety symptoms.  Recommend hydroxyzine at bedtime to help with sleep and as needed during the day for panic symptoms if they present.  Educated patient on dosing schedule and follow-up. No alarm symptoms present today.  Will f/u in 4 weeks to monitor progression.  Continue with counseling. She will reach out if she feels medication is not effective or new symptoms develop after medication start.       Relevant  Medications   sertraline (ZOLOFT) 50 MG tablet   hydrOXYzine (ATARAX) 25 MG tablet   Other Relevant Orders   T4 (Completed)   T3 (Completed)   TSH (Completed)   Change in voice    Unclear etiology.  Evidence of mucosal edema is present consistent with allergies.  Will check thyroid panel today as this has not been monitored in the past and with new symptoms would like to rule out thyroid etiology.  Continue allergy medication routine and monitor for new or changing symptoms.  No alarm symptoms present today.  Will f/u based on lab results.       Relevant Orders   T4 (Completed)   T3 (Completed)   TSH (Completed)   Other Visit Diagnoses     Encounter for medical examination to establish care            Return in about 4 weeks (around 11/25/2021) for Virtual for anxiety.      Shadara Lopez, Coralee Pesa, NP, DNP, AGNP-C [facility] at [place]

## 2021-10-29 ENCOUNTER — Encounter (HOSPITAL_BASED_OUTPATIENT_CLINIC_OR_DEPARTMENT_OTHER): Payer: Self-pay | Admitting: Nurse Practitioner

## 2021-10-29 DIAGNOSIS — F411 Generalized anxiety disorder: Secondary | ICD-10-CM | POA: Insufficient documentation

## 2021-10-29 DIAGNOSIS — R499 Unspecified voice and resonance disorder: Secondary | ICD-10-CM | POA: Insufficient documentation

## 2021-10-29 LAB — T3: T3, Total: 97 ng/dL (ref 71–180)

## 2021-10-29 LAB — T4: T4, Total: 6.6 ug/dL (ref 4.5–12.0)

## 2021-10-29 LAB — TSH: TSH: 1.15 u[IU]/mL (ref 0.450–4.500)

## 2021-10-29 NOTE — Assessment & Plan Note (Signed)
Symptoms and presentation consistent with GAD likely exacerbated with stressors related to divorce and pandemic occurring at the same time.  Recommend trial of sertraline daily to help with anxiety symptoms.  Recommend hydroxyzine at bedtime to help with sleep and as needed during the day for panic symptoms if they present.  Educated patient on dosing schedule and follow-up. No alarm symptoms present today.  Will f/u in 4 weeks to monitor progression.  Continue with counseling. She will reach out if she feels medication is not effective or new symptoms develop after medication start.

## 2021-10-29 NOTE — Assessment & Plan Note (Signed)
Unclear etiology.  Evidence of mucosal edema is present consistent with allergies.  Will check thyroid panel today as this has not been monitored in the past and with new symptoms would like to rule out thyroid etiology.  Continue allergy medication routine and monitor for new or changing symptoms.  No alarm symptoms present today.  Will f/u based on lab results.

## 2021-11-06 ENCOUNTER — Ambulatory Visit (HOSPITAL_BASED_OUTPATIENT_CLINIC_OR_DEPARTMENT_OTHER): Payer: BC Managed Care – PPO | Admitting: Nurse Practitioner

## 2021-12-04 ENCOUNTER — Other Ambulatory Visit: Payer: Self-pay

## 2021-12-04 ENCOUNTER — Telehealth (INDEPENDENT_AMBULATORY_CARE_PROVIDER_SITE_OTHER): Payer: BC Managed Care – PPO | Admitting: Nurse Practitioner

## 2021-12-04 DIAGNOSIS — F411 Generalized anxiety disorder: Secondary | ICD-10-CM

## 2021-12-04 MED ORDER — SERTRALINE HCL 50 MG PO TABS: 50.0000 mg | ORAL_TABLET | Freq: Every day | ORAL | 3 refills | Status: DC

## 2021-12-04 NOTE — Progress Notes (Signed)
Virtual Visit Encounter  mychart visit. Converted to Telephone due to sound difficulties   I connected with  Joanna Terry on 12/06/21 at  8:50 AM EST by secure audio and/or video enabled telemedicine application. I verified that I am speaking with the correct person using two identifiers.   I introduced myself as a Publishing rights manager with the practice. The limitations of evaluation and management by telemedicine discussed with the patient and the availability of in person appointments. The patient expressed verbal understanding and consent to proceed.  Participating parties in this visit include: Myself and patient  The patient is: Patient Location: Home I am: Provider Location: Office/Clinic Subjective:    CC and HPI: Joanna Terry is a 41 y.o. year old female presenting for follow up of anxiety. She reports starting the sertraline and is tolerating the medication well. She had some diarrhea and nausea inittialy, but resolving at this time. She reports she is feeling much better mentally and feels that the medication is working well for her.  She denies any thoughts of SI/HI. She has taken the hydroxyzine for sleep a few times, but has not needed it every night.  She feels the current regimen is working well for her.   Past medical history, Surgical history, Family history not pertinant except as noted below, Social history, Allergies, and medications have been entered into the medical record, reviewed, and corrections made.   Review of Systems:  All review of systems negative except what is listed in the HPI  Objective:    Alert and oriented x 4 Speaking in clear sentences with no shortness of breath. No distress.  Impression and Recommendations:    Problem List Items Addressed This Visit     Generalized anxiety disorder    Doing well on current dose of sertraline.  Continue at current dose, but option to increase if needed is there. Continue to use hydroxyzine as needed  for sleep.  Continue with counseling services.  If your symptoms worsen or seem to stall in improvement, please do not hesitate to let me know.  We will plan to follow-up in 6 months to see how you are doing.       Relevant Medications   sertraline (ZOLOFT) 50 MG tablet    current treatment plan is effective, no change in therapy, orders and follow up as documented in EMR I discussed the assessment and treatment plan with the patient. The patient was provided an opportunity to ask questions and all were answered. The patient agreed with the plan and demonstrated an understanding of the instructions.   The patient was advised to call back or seek an in-person evaluation if the symptoms worsen or if the condition fails to improve as anticipated.  Follow-Up: in 6 months  I provided 18 minutes of non-face-to-face interaction with this non face-to-face encounter including intake, same-day documentation, and chart review.   Tollie Eth, NP , DNP, AGNP-c [facility] Medicine at [facility] [phone] (fax)

## 2021-12-06 ENCOUNTER — Encounter (HOSPITAL_BASED_OUTPATIENT_CLINIC_OR_DEPARTMENT_OTHER): Payer: Self-pay | Admitting: Nurse Practitioner

## 2021-12-06 NOTE — Assessment & Plan Note (Signed)
Doing well on current dose of sertraline.  Continue at current dose, but option to increase if needed is there. Continue to use hydroxyzine as needed for sleep.  Continue with counseling services.  If your symptoms worsen or seem to stall in improvement, please do not hesitate to let me know.  We will plan to follow-up in 6 months to see how you are doing.

## 2022-05-28 NOTE — Progress Notes (Addendum)
41 y.o. G0P0000 Divorced   Unavailable White or Caucasian Unavailable female here for annual exam.   Period Cycle (Days): 26 (26-29) Period Duration (Days): 4 Period Pattern: Regular Menstrual Flow: Heavy Menstrual Control: Tampon Menstrual Control Change Freq (Hours): 3-4 Dysmenorrhea: (!) Mild Dysmenorrhea Symptoms: Cramping, Headache  Using condoms for contraception. No pain.   Tyrer-Cuzick risk of breast cancer is 36.9%    Patient's last menstrual period was 05/25/2022.          Sexually active: Yes.    The current method of family planning is condoms always.    Exercising: Yes.     Cardio and strength training  Smoker:  no  Health Maintenance: Pap:    05/21/20 Neg, neg HR HPV (next pap due in 6/24) 05/18/19 Neg:Neg HR HPV             05/13/2018 ASC-H NEG HPV             05-02-15 WNL NEG HR HPV  History of abnormal Pap:  yes colpo 05/20/18 unsatisfactory, ECC normal  MMG:  08/05/21 Bi-rads 1 neg  Breast MR 01/21/21: negative BMD:   none  Colonoscopy: no  TDaP:  05/11/17 Gardasil: complete    reports that she has never smoked. She has never used smokeless tobacco. She reports current alcohol use of about 2.0 - 4.0 standard drinks of alcohol per week. She reports that she does not use drugs. She works at [employer] in Engineer, mining.   Past Medical History:  Diagnosis Date   Allergic rhinitis 05/22/2021   Hives    chronic history   Pain in joint of right knee 01/22/2021    Past Surgical History:  Procedure Laterality Date   TONSILLECTOMY AND ADENOIDECTOMY  age 61 or 60   WISDOM TOOTH EXTRACTION      Current Outpatient Medications  Medication Sig Dispense Refill   cetirizine (ZYRTEC) 10 MG tablet Take 10 mg by mouth daily.     EPIPEN 2-PAK 0.3 MG/0.3ML SOAJ injection Reported on 05/06/2016  1   hydrOXYzine (ATARAX) 25 MG tablet Take 1 tab ($Remo'25mg'TWvJY$ ) by mouth as needed for anxiety and sleep. May repeat dose if not effective after 30 minutes. 60 tablet 3   sertraline (ZOLOFT) 50 MG tablet  Take 1 tablet (50 mg total) by mouth at bedtime. 90 tablet 3   No current facility-administered medications for this visit.    Family History  Problem Relation Age of Onset   Breast cancer Mother 87       BRCA I/ II negative   Hyperlipidemia Mother    Prostate cancer Father 37   Breast cancer Maternal Grandmother 55    Review of Systems  All other systems reviewed and are negative.   Exam:   BP 110/62   Pulse 71   Ht $R'5\' 7"'Sm$  (1.702 m)   Wt 151 lb (68.5 kg)   LMP 05/25/2022   SpO2 99%   BMI 23.65 kg/m   Weight change: $RemoveBefore'@WEIGHTCHANGE'lahXsfHgkGEPT$ @ Height:   Height: $Remove'5\' 7"'dyjSHuX$  (170.2 cm)  Ht Readings from Last 3 Encounters:  06/02/22 $RemoveB'5\' 7"'VWDgHKPu$  (1.702 m)  10/28/21 $RemoveB'5\' 8"'bXjwEqRv$  (1.727 m)  05/22/21 5' 7.5" (1.715 m)    General appearance: alert, cooperative and appears stated age Head: Normocephalic, without obvious abnormality, atraumatic Neck: no adenopathy, supple, symmetrical, trachea midline and thyroid normal to inspection and palpation Lungs: clear to auscultation bilaterally Cardiovascular: regular rate and rhythm Breasts: normal appearance, no masses or tenderness Abdomen: soft, non-tender; non distended,  no masses,  no organomegaly Extremities: extremities normal, atraumatic, no cyanosis or edema Skin: Skin color, texture, turgor normal. No rashes or lesions Lymph nodes: Cervical, supraclavicular, and axillary nodes normal. No abnormal inguinal nodes palpated Neurologic: Grossly normal   Pelvic: External genitalia:  no lesions              Urethra:  normal appearing urethra with no masses, tenderness or lesions              Bartholins and Skenes: normal                 Vagina: normal appearing vagina with normal color and discharge, no lesions              Cervix: no lesions               Bimanual Exam:  Uterus:  normal size, contour, position, consistency, mobility, non-tender              Adnexa: no mass, fullness, tenderness               Rectovaginal: Confirms               Anus:   normal sphincter tone, no lesions  Gae Dry, CMA chaperoned for the exam.  1. Well woman exam Discussed breast self exam Discussed calcium and vit D intake Labs UTD  2. General counseling and advice on female contraception Using condoms - ulipristal acetate (ELLA) 30 MG tablet; Take 1 tablet (30 mg total) by mouth once for 1 dose.  Dispense: 1 tablet; Refill: 0  3. At high risk for breast cancer Mammogram in 9/23 She wants to do the MRI's every other year. Will schedule in 2/24  Addendum: Pap is due next year.

## 2022-06-02 ENCOUNTER — Encounter: Payer: Self-pay | Admitting: Obstetrics and Gynecology

## 2022-06-02 ENCOUNTER — Ambulatory Visit (INDEPENDENT_AMBULATORY_CARE_PROVIDER_SITE_OTHER): Payer: BC Managed Care – PPO | Admitting: Obstetrics and Gynecology

## 2022-06-02 ENCOUNTER — Telehealth: Payer: Self-pay

## 2022-06-02 VITALS — BP 110/62 | HR 71 | Ht 67.0 in | Wt 151.0 lb

## 2022-06-02 DIAGNOSIS — Z3009 Encounter for other general counseling and advice on contraception: Secondary | ICD-10-CM | POA: Diagnosis not present

## 2022-06-02 DIAGNOSIS — Z9189 Other specified personal risk factors, not elsewhere classified: Secondary | ICD-10-CM

## 2022-06-02 DIAGNOSIS — Z01419 Encounter for gynecological examination (general) (routine) without abnormal findings: Secondary | ICD-10-CM

## 2022-06-02 MED ORDER — ULIPRISTAL ACETATE 30 MG PO TABS: 1.0000 | ORAL_TABLET | Freq: Once | ORAL | 0 refills | Status: AC

## 2022-06-02 NOTE — Patient Instructions (Signed)

## 2022-06-02 NOTE — Telephone Encounter (Signed)
Romualdo Bolk, MD  P [facility] Triage She needs a breast MRI in 2/24. Elevated risk of breast cancer. She is getting them every other year.

## 2022-06-25 ENCOUNTER — Encounter (HOSPITAL_BASED_OUTPATIENT_CLINIC_OR_DEPARTMENT_OTHER): Payer: Self-pay | Admitting: Nurse Practitioner

## 2022-06-26 MED ORDER — BENZONATATE 200 MG PO CAPS: 200.0000 mg | ORAL_CAPSULE | Freq: Three times a day (TID) | ORAL | 0 refills | Status: DC | PRN

## 2022-07-15 ENCOUNTER — Ambulatory Visit (INDEPENDENT_AMBULATORY_CARE_PROVIDER_SITE_OTHER): Payer: BC Managed Care – PPO | Admitting: Nurse Practitioner

## 2022-07-15 ENCOUNTER — Encounter (HOSPITAL_BASED_OUTPATIENT_CLINIC_OR_DEPARTMENT_OTHER): Payer: Self-pay | Admitting: Nurse Practitioner

## 2022-07-15 VITALS — BP 96/67 | HR 95 | Ht 68.0 in | Wt 151.0 lb

## 2022-07-15 DIAGNOSIS — R052 Subacute cough: Secondary | ICD-10-CM

## 2022-07-15 MED ORDER — HYDROCODONE BIT-HOMATROP MBR 5-1.5 MG/5ML PO SOLN: 5.0000 mL | Freq: Every evening | ORAL | 0 refills | Status: DC | PRN

## 2022-07-15 MED ORDER — ALBUTEROL SULFATE HFA 108 (90 BASE) MCG/ACT IN AERS: 2.0000 | INHALATION_SPRAY | Freq: Four times a day (QID) | RESPIRATORY_TRACT | 11 refills | Status: DC | PRN

## 2022-07-15 NOTE — Patient Instructions (Signed)
I have sent in Hycodan cough syrup to help with the cough at bedtime. I have also sent an albuterol inhaler that you can use to help with the cough. If these do not help, please let me know and we can try something else.

## 2022-07-15 NOTE — Progress Notes (Signed)
  Tollie Eth, DNP, AGNP-c Primary [facility] Medicine [address]  [address] [place], [place] [address] [phone]  Subjective:   Joanna Terry is a 41 y.o. female presents to day for cough. Cough Ongoing night time cough following COVID-19 infection 07/29 Multiple night time awakenings with coughing Non productive No ShOB present, fevers, chills, body aches, dizziness.  Wheezing audible at times.    PMH, Medications, and Allergies reviewed and updated in chart.   ROS negative except for what is listed in HPI. Objective:  BP 96/67   Pulse 95   Ht 5\' 8"  (1.727 m)   Wt 151 lb (68.5 kg)   SpO2 99%   BMI 22.96 kg/m  Physical Exam Vitals and nursing note reviewed.  Constitutional:      General: She is not in acute distress.    Appearance: Normal appearance.  HENT:     Head: Normocephalic.  Eyes:     Extraocular Movements: Extraocular movements intact.     Pupils: Pupils are equal, round, and reactive to light.  Cardiovascular:     Rate and Rhythm: Normal rate and regular rhythm.     Pulses: Normal pulses.     Heart sounds: Normal heart sounds.  Pulmonary:     Effort: Pulmonary effort is normal.     Breath sounds: Wheezing present.     Comments: cough Musculoskeletal:     Cervical back: Normal range of motion.  Lymphadenopathy:     Cervical: No cervical adenopathy.  Skin:    General: Skin is warm and dry.     Capillary Refill: Capillary refill takes less than 2 seconds.  Neurological:     General: No focal deficit present.     Mental Status: She is alert and oriented to person, place, and time.  Psychiatric:        Mood and Affect: Mood normal.        Behavior: Behavior normal.        Thought Content: Thought content normal.        Judgment: Judgment normal.           Assessment & Plan:   Problem List Items Addressed This Visit     Subacute cough - Primary    Ongoing cough, worse at night, following COVID infection  appx one month ago. Suspect bronchial inflammation triggering symptoms. Wheezing present today. Will send treatment with albuterol prn and hycodan for night time use to help with cough. If no improvement in 2 weeks, recommend f/u for CXR to ensure no concerning etiology present. At this time no signs of Pna       Relevant Medications   HYDROcodone bit-homatropine (HYCODAN) 5-1.5 MG/5ML syrup   albuterol (VENTOLIN HFA) 108 (90 Base) MCG/ACT inhaler     , DNP, AGNP-c 08/22/2022  4:17 PM

## 2022-07-22 ENCOUNTER — Ambulatory Visit (HOSPITAL_BASED_OUTPATIENT_CLINIC_OR_DEPARTMENT_OTHER): Payer: BC Managed Care – PPO | Admitting: Nurse Practitioner

## 2022-08-22 DIAGNOSIS — R052 Subacute cough: Secondary | ICD-10-CM | POA: Insufficient documentation

## 2022-08-22 NOTE — Assessment & Plan Note (Signed)
Ongoing cough, worse at night, following COVID infection appx one month ago. Suspect bronchial inflammation triggering symptoms. Wheezing present today. Will send treatment with albuterol prn and hycodan for night time use to help with cough. If no improvement in 2 weeks, recommend f/u for CXR to ensure no concerning etiology present. At this time no signs of Pna

## 2022-09-01 ENCOUNTER — Other Ambulatory Visit: Payer: Self-pay | Admitting: Obstetrics and Gynecology

## 2022-09-01 DIAGNOSIS — Z1231 Encounter for screening mammogram for malignant neoplasm of breast: Secondary | ICD-10-CM

## 2022-10-01 ENCOUNTER — Other Ambulatory Visit (HOSPITAL_BASED_OUTPATIENT_CLINIC_OR_DEPARTMENT_OTHER): Payer: Self-pay | Admitting: Nurse Practitioner

## 2022-10-01 ENCOUNTER — Ambulatory Visit
Admission: RE | Admit: 2022-10-01 | Discharge: 2022-10-01 | Disposition: A | Discharge status: Home / Self Care | Payer: BC Managed Care – PPO | Source: Ambulatory Visit | Attending: Obstetrics and Gynecology | Admitting: Obstetrics and Gynecology

## 2022-10-01 DIAGNOSIS — F411 Generalized anxiety disorder: Secondary | ICD-10-CM

## 2022-10-01 DIAGNOSIS — Z1231 Encounter for screening mammogram for malignant neoplasm of breast: Secondary | ICD-10-CM

## 2022-12-14 IMAGING — MG MM DIGITAL SCREENING BILAT W/ TOMO AND CAD
8 series · 9 of 24 positions shown · non-contrast
Comparison: Previous exam(s).

CLINICAL DATA: Screening.

EXAM:
DIGITAL SCREENING BILATERAL MAMMOGRAM WITH TOMOSYNTHESIS AND CAD
TECHNIQUE: Bilateral screening digital craniocaudal and mediolateral oblique
mammograms were obtained. Bilateral screening digital breast
tomosynthesis was performed. The images were evaluated with
computer-aided detection.

[L MLO synth-2D]
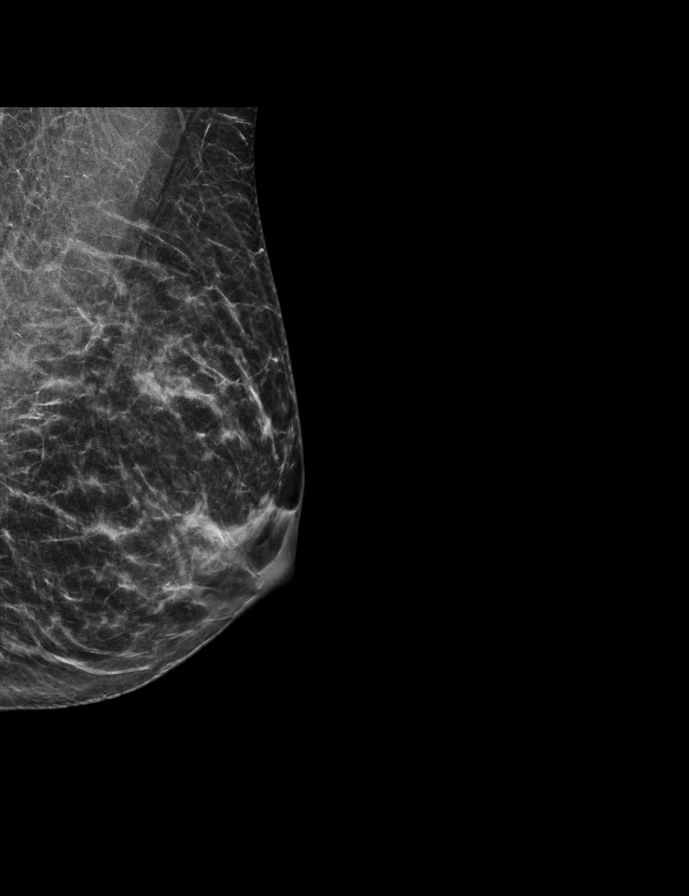

[R MLO synth-2D]
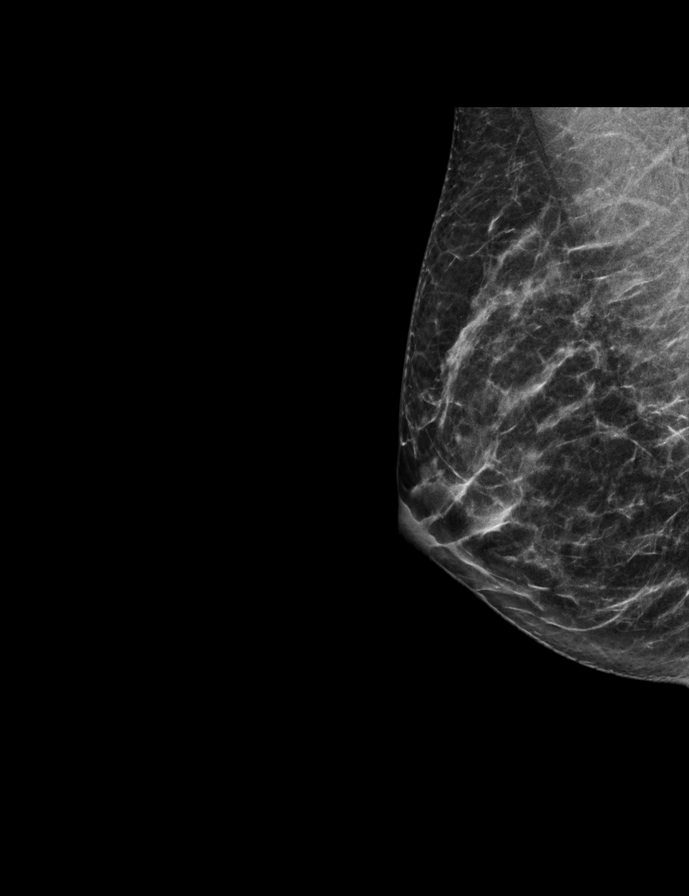

[L CC synth-2D]
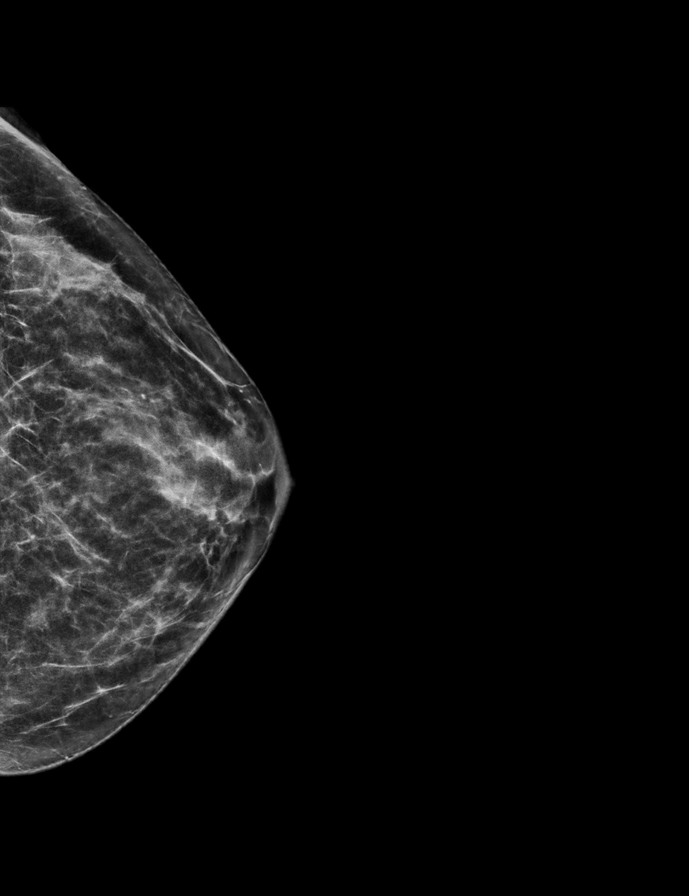

[R CC synth-2D]
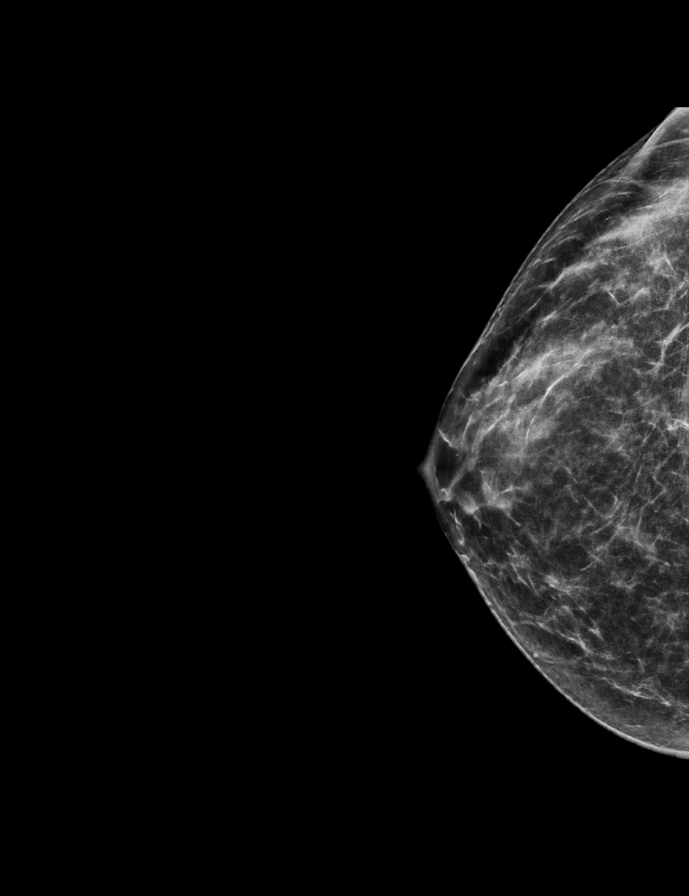

[L MLO tomo · 2 of 53 frames shown]
[frame 18/53]
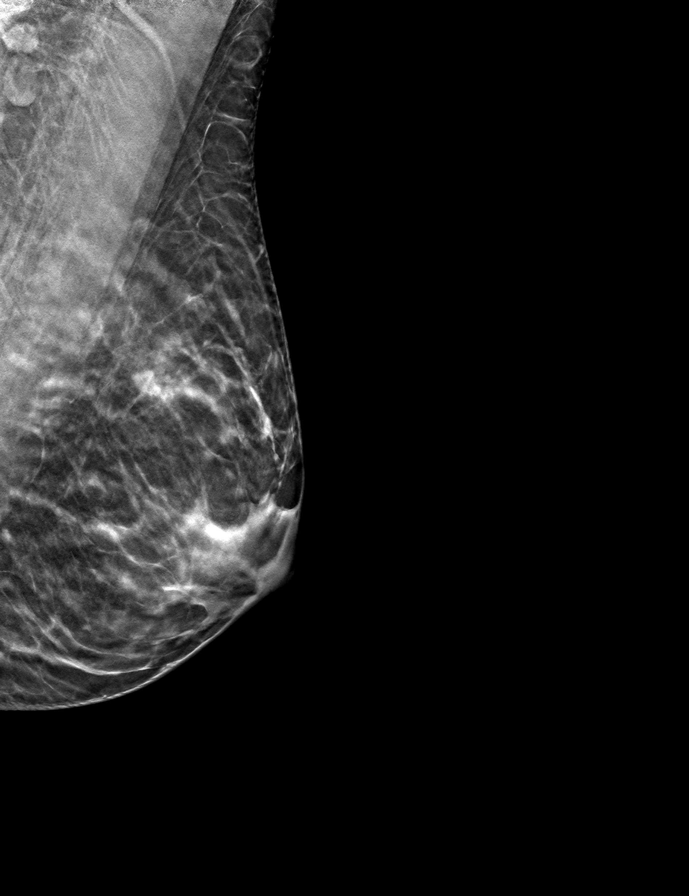
[frame 27/53]
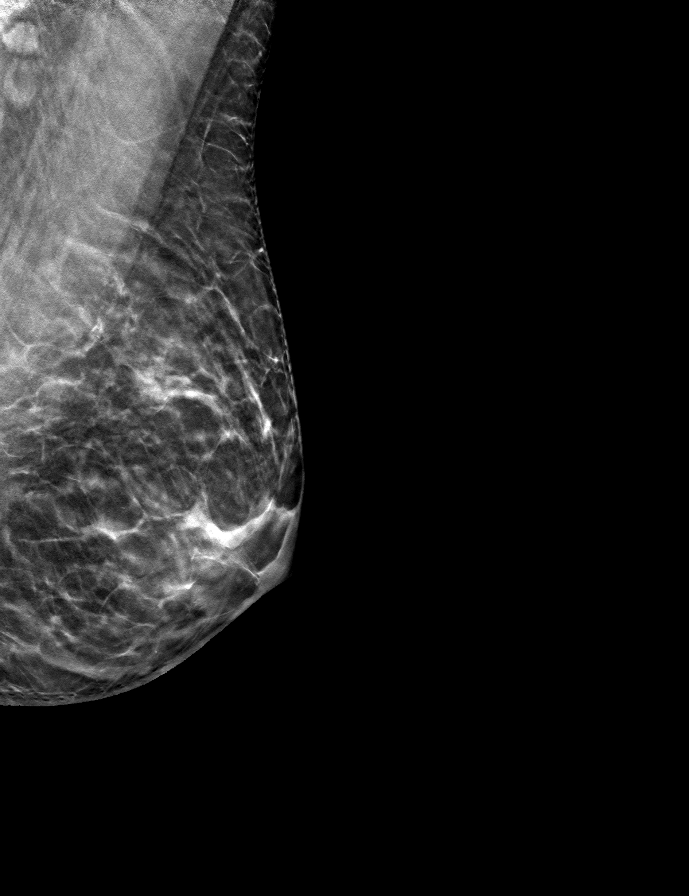

[R CC tomo · tomo slice 28/55.0]
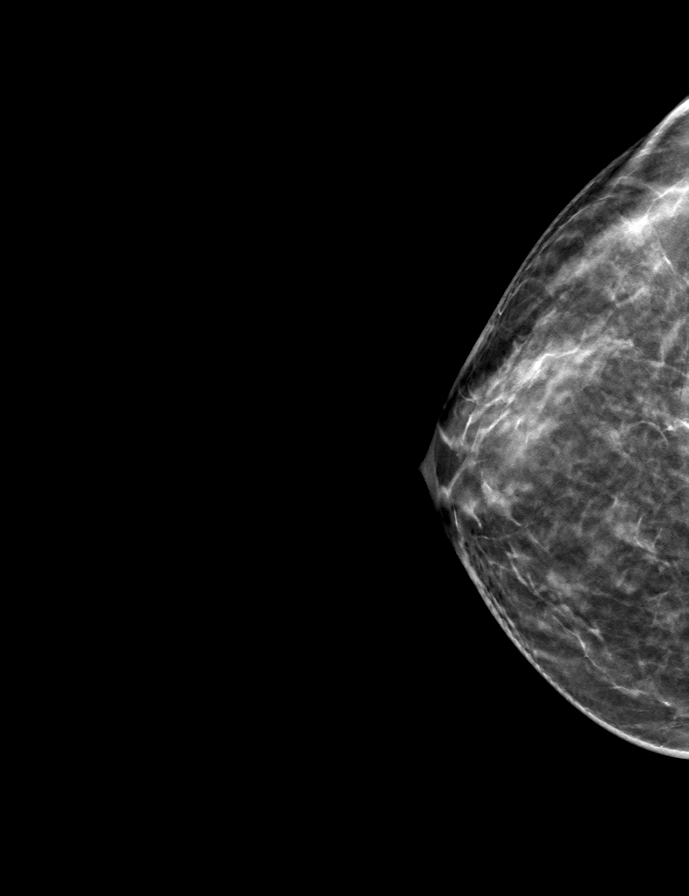

[L CC tomo · tomo slice 29/58.0]
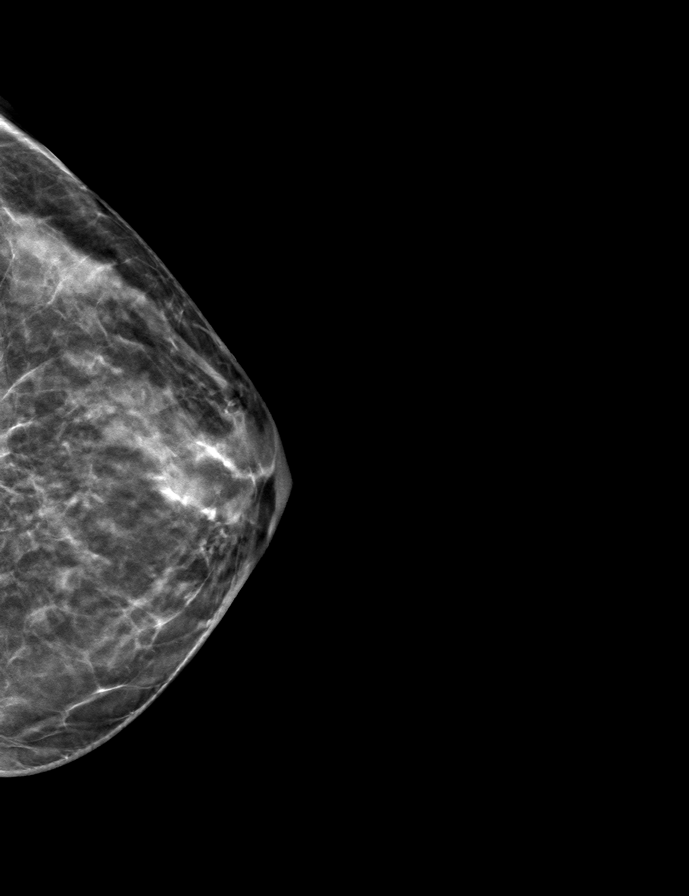

[R MLO tomo · tomo slice 27/53.0]
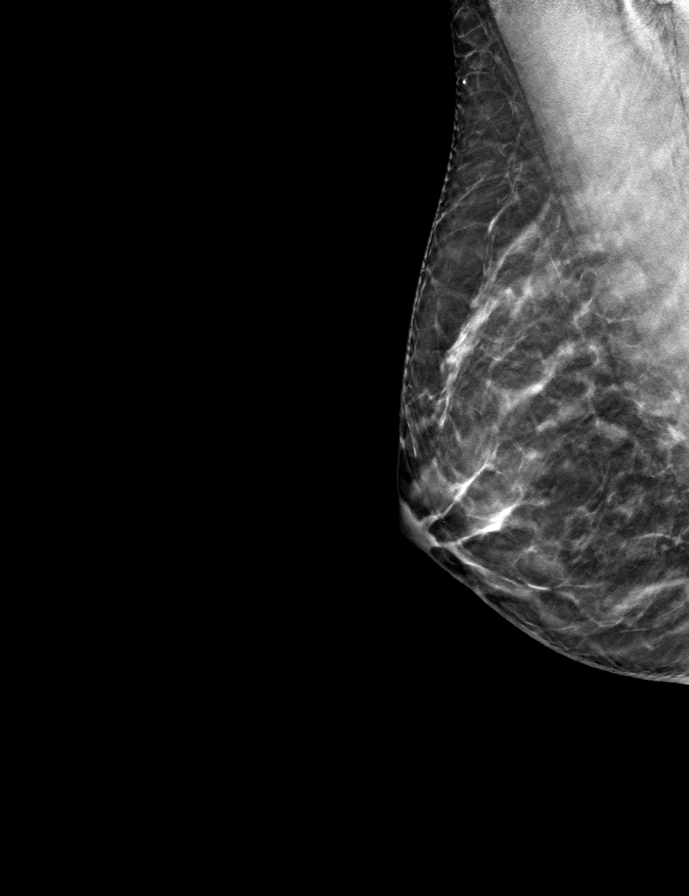

[9 of 24 positions shown; findings below may reference images not displayed]

ACR Breast Density Category c: The breast tissue is heterogeneously
dense, which may obscure small masses.
FINDINGS: There are no findings suspicious for malignancy.
IMPRESSION: No mammographic evidence of malignancy. A result letter of this
screening mammogram will be mailed directly to the patient.

RECOMMENDATION:
Screening mammogram in one year. (Code:Q3-W-BC3)

BI-RADS CATEGORY  1: Negative.

## 2022-12-30 NOTE — Telephone Encounter (Signed)
Called patient and per DPR access note on file I left message that I am following up on message from Dr. Dewitt Hoes 7 mos ago that she needs MR breast in 12/2022.  I asked her to call and let me know she is ready to schedule and I will place order and call her instructions.

## 2023-02-05 NOTE — Telephone Encounter (Signed)
Patient called back and said she is ready to schedule Breast MRI. Order placed.

## 2023-02-05 NOTE — Telephone Encounter (Signed)
Left another message for patient to call about scheduling Breast MRI. Asked her to call.

## 2023-02-05 NOTE — Telephone Encounter (Signed)
Bilateral Breast MRI scheduled 03/02/23 at [facility].  Patient spoke with them to schedule.  Routed to [facility]. For PA inquiry.

## 2023-03-02 ENCOUNTER — Ambulatory Visit
Admission: RE | Admit: 2023-03-02 | Discharge: 2023-03-02 | Disposition: A | Discharge status: Home / Self Care | Payer: BC Managed Care – PPO | Source: Ambulatory Visit | Attending: Obstetrics and Gynecology | Admitting: Obstetrics and Gynecology

## 2023-03-02 DIAGNOSIS — Z9189 Other specified personal risk factors, not elsewhere classified: Secondary | ICD-10-CM

## 2023-03-02 MED ORDER — GADOPICLENOL 0.5 MMOL/ML IV SOLN
7.0000 mL | Freq: Once | INTRAVENOUS | Status: AC | PRN
  Administered 2023-03-02: 7 mL via INTRAVENOUS

## 2023-03-23 ENCOUNTER — Ambulatory Visit (INDEPENDENT_AMBULATORY_CARE_PROVIDER_SITE_OTHER): Payer: BC Managed Care – PPO | Admitting: Family Medicine

## 2023-03-23 ENCOUNTER — Encounter (HOSPITAL_BASED_OUTPATIENT_CLINIC_OR_DEPARTMENT_OTHER): Payer: Self-pay | Admitting: Family Medicine

## 2023-03-23 VITALS — BP 114/73 | HR 74 | Ht 68.0 in | Wt 156.2 lb

## 2023-03-23 DIAGNOSIS — Z0000 Encounter for general adult medical examination without abnormal findings: Secondary | ICD-10-CM | POA: Diagnosis not present

## 2023-03-23 DIAGNOSIS — F411 Generalized anxiety disorder: Secondary | ICD-10-CM | POA: Diagnosis not present

## 2023-03-23 MED ORDER — SERTRALINE HCL 25 MG PO TABS: 25.0000 mg | ORAL_TABLET | Freq: Every day | ORAL | 1 refills | Status: DC

## 2023-03-23 NOTE — Progress Notes (Signed)
Complete physical exam  Patient: Joanna Terry   DOB: 1981-02-22   42 y.o. Female  MRN: [id number]  Subjective:    Joanna Terry is a 42 y.o. female who presents today for a complete physical exam. She reports consuming a general diet. Gym/ health club routine includes cardio and light weights. She generally feels well. She reports sleeping well. She does not have additional problems to discuss today.   Reports her mood is well controlled on Zoloft 25mg  daily. Reports she had some GI side effects, mainly loose stools, taking 50mg  daily. Reports she is tolerating 25mg  with no adverse side effects.   I reviewed the past medical history, family history, social history, surgical history, and allergies today and no changes were needed.  Please see the problem list section below in epic for further details.   Depression screenings:    03/23/2023    4:02 PM 10/29/2021    7:54 AM  Depression screen PHQ 2/9  Decreased Interest 0 1  Down, Depressed, Hopeless 0 0  PHQ - 2 Score 0 1  Altered sleeping 1 2  Tired, decreased energy 1 1  Change in appetite 0 0  Feeling bad or failure about yourself  0 0  Trouble concentrating 0 1  Moving slowly or fidgety/restless 0 0  Suicidal thoughts 0 0  PHQ-9 Score 2 5  Difficult doing work/chores Not difficult at all Somewhat difficult    Anxiety screenings:    03/23/2023    4:03 PM 10/29/2021    7:55 AM  GAD 7 : Generalized Anxiety Score  Nervous, Anxious, on Edge 1 1  Control/stop worrying 0 1  Worry too much - different things 0 1  Trouble relaxing 0 1  Restless 0 1  Easily annoyed or irritable 0 1  Afraid - awful might happen 0 1  Total GAD 7 Score 1 7  Anxiety Difficulty Not difficult at all Somewhat difficult   Vision: UTD Dentist: UTD  Patient Care Team: Alyson Reedy, FNP as PCP - General (Family Medicine) Romualdo Bolk, MD as Consulting Physician (Obstetrics and Gynecology)   Outpatient Medications Prior to Visit   Medication Sig   albuterol (VENTOLIN HFA) 108 (90 Base) MCG/ACT inhaler Inhale 2 puffs into the lungs every 6 (six) hours as needed for wheezing (cough).   EPIPEN 2-PAK 0.3 MG/0.3ML SOAJ injection Reported on 05/06/2016   hydrOXYzine (ATARAX) 25 MG tablet Take 1 tab (25mg ) by mouth as needed for anxiety and sleep. May repeat dose if not effective after 30 minutes.   Loratadine 10 MG CAPS    [DISCONTINUED] sertraline (ZOLOFT) 50 MG tablet TAKE 1 TABLET(50 MG) BY MOUTH AT BEDTIME (Patient taking differently: Take 25 mg by mouth daily.)   [DISCONTINUED] cetirizine (ZYRTEC) 10 MG tablet Take 10 mg by mouth daily.   [DISCONTINUED] HYDROcodone bit-homatropine (HYCODAN) 5-1.5 MG/5ML syrup Take 5 mLs by mouth at bedtime as needed for cough.   No facility-administered medications prior to visit.    Review of Systems  Constitutional:  Negative for malaise/fatigue and weight loss.  Eyes:  Negative for blurred vision and double vision.  Respiratory:  Negative for cough and shortness of breath.   Cardiovascular:  Negative for chest pain and palpitations.  Gastrointestinal:  Negative for abdominal pain, nausea and vomiting.  Musculoskeletal:  Negative for myalgias.  Neurological:  Negative for dizziness, weakness and headaches.  Psychiatric/Behavioral:  Negative for depression, substance abuse and suicidal ideas. The patient is not nervous/anxious and does not  have insomnia.        Objective:     BP 114/73   Pulse 74   Ht 5\' 8"  (1.727 m)   Wt 156 lb 3.2 oz (70.9 kg)   LMP 03/09/2023 (Exact Date)   SpO2 100%   BMI 23.75 kg/m  BP Readings from Last 3 Encounters:  03/23/23 114/73  07/15/22 96/67  06/02/22 110/62     Physical Exam Constitutional:      Appearance: Normal appearance. She is normal weight.  HENT:     Right Ear: Tympanic membrane, ear canal and external ear normal.     Left Ear: Tympanic membrane, ear canal and external ear normal.     Nose: Nose normal.     Mouth/Throat:      Mouth: Mucous membranes are moist.     Pharynx: Oropharynx is clear.  Eyes:     Extraocular Movements: Extraocular movements intact.     Pupils: Pupils are equal, round, and reactive to light.  Cardiovascular:     Rate and Rhythm: Normal rate and regular rhythm.     Pulses: Normal pulses.     Heart sounds: Normal heart sounds.  Pulmonary:     Effort: Pulmonary effort is normal.     Breath sounds: Normal breath sounds.  Abdominal:     General: Abdomen is flat. Bowel sounds are normal.     Palpations: Abdomen is soft.  Musculoskeletal:        General: Normal range of motion.     Cervical back: Normal range of motion.  Skin:    General: Skin is warm and dry.  Neurological:     Mental Status: She is alert.  Psychiatric:        Mood and Affect: Mood normal.        Behavior: Behavior normal.        Thought Content: Thought content normal.        Judgment: Judgment normal.     Assessment & Plan:    Routine Health Maintenance and Physical Exam  Health Maintenance  Topic Date Due   COVID-19 Vaccine (2 - 2023-24 season) 07/25/2022   Pap Smear  05/22/2023   Flu Shot  06/25/2023   DTaP/Tdap/Td vaccine (3 - Td or Tdap) 05/12/2027   HPV Vaccine  Completed   Hepatitis C Screening: USPSTF Recommendation to screen - Ages 38-79 yo.  Completed   HIV Screening  Completed   1. Wellness examination Routine HCM labs ordered. Will obtain labs today and update patient with results. Review of PMH, FH, SH, medications and HM performed.  Recommend healthy diet.  Recommend approximately 150 minutes/week of moderate intensity exercise about 3-5 times per week.  Recommend regular dental and vision exams. Always use seatbelt/lap and shoulder restraints. Recommend using smoke alarms and checking batteries at least twice a year. Recommend using sunscreen when outside. Discussed immunization recommendations. Vaccines are UTD.   Vaccines are up-to-date:  yes MMG:  MRI breast done 03/02/23, high  risk Pap smear:  05/21/2020, Negative HPV.  Due 04/2023 She plans to schedule with GYN provider but advised her if she is unable to establish with new provider then to call and schedule with [place].    Return in about 6 months (around 09/22/2023) for Mood f/u.   Alyson Reedy, FNP

## 2023-03-24 LAB — COMPREHENSIVE METABOLIC PANEL
ALT: 13 IU/L (ref 0–32)
AST: 23 IU/L (ref 0–40)
Albumin/Globulin Ratio: 2.2 (ref 1.2–2.2)
Albumin: 4.4 g/dL (ref 3.9–4.9)
Alkaline Phosphatase: 57 IU/L (ref 44–121)
BUN/Creatinine Ratio: 16 (ref 9–23)
BUN: 13 mg/dL (ref 6–24)
Bilirubin Total: 0.4 mg/dL (ref 0.0–1.2)
CO2: 22 mmol/L (ref 20–29)
Calcium: 9.6 mg/dL (ref 8.7–10.2)
Chloride: 104 mmol/L (ref 96–106)
Creatinine, Ser: 0.79 mg/dL (ref 0.57–1.00)
Globulin, Total: 2 g/dL (ref 1.5–4.5)
Glucose: 92 mg/dL (ref 70–99)
Potassium: 5.3 mmol/L — ABNORMAL HIGH (ref 3.5–5.2)
Sodium: 138 mmol/L (ref 134–144)
Total Protein: 6.4 g/dL (ref 6.0–8.5)
eGFR: 95 mL/min/{1.73_m2} (ref >59)

## 2023-03-24 LAB — CBC WITH DIFFERENTIAL/PLATELET
Basophils Absolute: 0.1 10*3/uL (ref 0.0–0.2)
Basos: 1 %
EOS (ABSOLUTE): 0.3 10*3/uL (ref 0.0–0.4)
Eos: 3 %
Hematocrit: 40.1 % (ref 34.0–46.6)
Hemoglobin: 13.1 g/dL (ref 11.1–15.9)
Immature Grans (Abs): 0 10*3/uL (ref 0.0–0.1)
Immature Granulocytes: 0 %
Lymphocytes Absolute: 3.1 10*3/uL (ref 0.7–3.1)
Lymphs: 34 %
MCH: 29.9 pg (ref 26.6–33.0)
MCHC: 32.7 g/dL (ref 31.5–35.7)
MCV: 92 fL (ref 79–97)
Monocytes Absolute: 0.7 10*3/uL (ref 0.1–0.9)
Monocytes: 7 %
Neutrophils Absolute: 5 10*3/uL (ref 1.4–7.0)
Neutrophils: 55 %
Platelets: 248 10*3/uL (ref 150–450)
RBC: 4.38 x10E6/uL (ref 3.77–5.28)
RDW: 11.1 % — ABNORMAL LOW (ref 11.7–15.4)
WBC: 9.1 10*3/uL (ref 3.4–10.8)

## 2023-03-24 LAB — LIPID PANEL
Chol/HDL Ratio: 2.2 ratio (ref 0.0–4.4)
Cholesterol, Total: 181 mg/dL (ref 100–199)
HDL: 83 mg/dL (ref >39)
LDL Chol Calc (NIH): 78 mg/dL (ref 0–99)
Triglycerides: 118 mg/dL (ref 0–149)
VLDL Cholesterol Cal: 20 mg/dL (ref 5–40)

## 2023-03-24 LAB — TSH RFX ON ABNORMAL TO FREE T4: TSH: 1.72 u[IU]/mL (ref 0.450–4.500)

## 2023-03-24 LAB — HEMOGLOBIN A1C
Est. average glucose Bld gHb Est-mCnc: 103 mg/dL
Hgb A1c MFr Bld: 5.2 % (ref 4.8–5.6)

## 2023-06-11 ENCOUNTER — Ambulatory Visit: Payer: BC Managed Care – PPO | Admitting: Radiology

## 2023-06-15 ENCOUNTER — Ambulatory Visit (HOSPITAL_BASED_OUTPATIENT_CLINIC_OR_DEPARTMENT_OTHER): Payer: BC Managed Care – PPO | Admitting: Family Medicine

## 2023-06-15 VITALS — BP 114/77 | HR 95 | Temp 99.4°F | Ht 68.0 in | Wt 156.6 lb

## 2023-06-15 DIAGNOSIS — J069 Acute upper respiratory infection, unspecified: Secondary | ICD-10-CM | POA: Diagnosis not present

## 2023-06-15 MED ORDER — PROMETHAZINE-DM 6.25-15 MG/5ML PO SYRP
5.0000 mL | ORAL_SOLUTION | Freq: Four times a day (QID) | ORAL | 0 refills | Status: DC | PRN
Start: 2023-06-15 — End: 2023-06-19

## 2023-06-15 NOTE — Progress Notes (Signed)
Acute Office Visit  Subjective:    Patient ID: Joanna Terry, female    DOB: 01/23/1981, 42 y.o.   MRN: [id number]  Chief Complaint  Patient presents with   Cough    Ongoing for about a week, productive cough, yellow like mucus, nasal congestion. Sinus pressure/headache. Tried mucinex   Eye Problem    Eyes leaking,    Joanna Terry is a 42 year-old female patient who presents today for concerns about an ongoing cough, nasal congestion, sinus pressure/headache, and watery eyes.   A few days ago, eyes starting leaking and noticing them being more crusty.  Cough is worse at night  Throat hurt last week but is not hurting today  Home covid tests were negative   Review of Systems  Constitutional:  Negative for chills, fever and malaise/fatigue.  HENT:  Positive for congestion and sinus pain. Negative for ear pain and sore throat.   Eyes:  Positive for discharge (itchiness) and redness.  Respiratory:  Positive for cough. Negative for shortness of breath.   Cardiovascular:  Negative for chest pain and palpitations.  Gastrointestinal:  Negative for nausea and vomiting.  Musculoskeletal:  Negative for myalgias.  Neurological:  Positive for headaches. Negative for dizziness and weakness.      Objective:    BP 114/77   Pulse 95   Temp 99.4 F (37.4 C) (Oral)   Ht 5\' 8"  (1.727 m)   Wt 156 lb 9.6 oz (71 kg)   SpO2 99%   BMI 23.81 kg/m   Physical Exam Constitutional:      Appearance: Normal appearance.  HENT:     Right Ear: Tympanic membrane, ear canal and external ear normal. No middle ear effusion. Tympanic membrane is not injected, erythematous or bulging.     Left Ear: Tympanic membrane, ear canal and external ear normal.  No middle ear effusion. Tympanic membrane is not injected, erythematous or bulging.     Nose: Congestion present.     Right Sinus: No maxillary sinus tenderness or frontal sinus tenderness.     Left Sinus: No maxillary sinus tenderness or frontal sinus  tenderness.     Mouth/Throat:     Mouth: Mucous membranes are moist.     Pharynx: Oropharynx is clear. Postnasal drip present. No pharyngeal swelling, oropharyngeal exudate or posterior oropharyngeal erythema.     Tonsils: No tonsillar exudate or tonsillar abscesses.  Eyes:     General: Allergic shiner (upper eyelids swollen) present.     Conjunctiva/sclera:     Right eye: Right conjunctiva is injected.     Left eye: Left conjunctiva is injected.  Cardiovascular:     Rate and Rhythm: Normal rate and regular rhythm.     Pulses: Normal pulses.     Heart sounds: Normal heart sounds.  Pulmonary:     Effort: Pulmonary effort is normal.     Breath sounds: Normal breath sounds.  Neurological:     Mental Status: She is alert.  Psychiatric:        Mood and Affect: Mood normal.        Behavior: Behavior normal.      Assessment & Plan:   1. Viral URI with cough Patient presents today with concerns of cough, congestion, sinus pressure, and watery/crusted eyes. Denies fever/chills, decreased appetite, ear pressure/pain, sore throat, and shortness of breath. No frontal or maxillary sinus tenderness present on exam, clear lung sounds auscultated in all lung fields. Nasal congestion present and slightly swollen upper eyelids. Counseled  patient regarding possible viral infection that will resolve on its own over time.  Symptomatic treatment is ideal at this time. Symptoms can stretch out to 10-14 days. Use Neti pot or nasal saline to help with nasal drainage/congestion.  Also reasonable to to use an over the counter Tylenol to help with cough, pain, and fevers/chills. Warm fluids with honey are also helpful. Humidification may be beneficial.  Unfortunately, antibiotics are not helpful for viral infections. Drink plenty of fluids and stay hydrated. Call if you are not improving by 7-10 days as this may have progressed to a bacterial infection. Advised patient to call office by Thursday if she is not  feeling any better.  - promethazine-dextromethorphan (PROMETHAZINE-DM) 6.25-15 MG/5ML syrup; Take 5 mLs by mouth 4 (four) times daily as needed for cough (Maximum dose: 30mL in 24 hours).  Dispense: 118 mL; Refill: 0  Return if symptoms worsen or fail to improve.  Alyson Reedy, FNP

## 2023-06-19 ENCOUNTER — Encounter (HOSPITAL_BASED_OUTPATIENT_CLINIC_OR_DEPARTMENT_OTHER): Payer: Self-pay | Admitting: Family Medicine

## 2023-06-19 MED ORDER — HYDROCODONE BIT-HOMATROP MBR 5-1.5 MG/5ML PO SOLN: 5.0000 mL | Freq: Four times a day (QID) | ORAL | 0 refills | Status: DC | PRN

## 2023-07-29 ENCOUNTER — Encounter: Payer: Self-pay | Admitting: Radiology

## 2023-07-29 ENCOUNTER — Other Ambulatory Visit (HOSPITAL_COMMUNITY)
Admission: RE | Admit: 2023-07-29 | Discharge: 2023-07-29 | Disposition: A | Discharge status: Home / Self Care | Payer: BC Managed Care – PPO | Source: Ambulatory Visit | Attending: Radiology | Admitting: Radiology

## 2023-07-29 ENCOUNTER — Ambulatory Visit (INDEPENDENT_AMBULATORY_CARE_PROVIDER_SITE_OTHER): Payer: BC Managed Care – PPO | Admitting: Radiology

## 2023-07-29 VITALS — BP 118/78 | Ht 67.0 in | Wt 156.0 lb

## 2023-07-29 DIAGNOSIS — Z9189 Other specified personal risk factors, not elsewhere classified: Secondary | ICD-10-CM | POA: Diagnosis not present

## 2023-07-29 DIAGNOSIS — N921 Excessive and frequent menstruation with irregular cycle: Secondary | ICD-10-CM | POA: Diagnosis not present

## 2023-07-29 DIAGNOSIS — Z01419 Encounter for gynecological examination (general) (routine) without abnormal findings: Secondary | ICD-10-CM | POA: Insufficient documentation

## 2023-07-29 NOTE — Progress Notes (Signed)
   Sorayah Geer 05/17/1981 [id number]   History:  42 y.o. G0 presents for annual exam. C/o irregular periods, heavy at times since coming off OCPs. Using condoms for [facility] with partner, no new partner since last STI screen. She does have an increased risk of breast cancer, TC risk 36.9%. Breast MRI and mammogram negative within the past year.  Gynecologic History Patient's last menstrual period was 07/24/2023 (exact date). Period Cycle (Days):  (28-33) Period Duration (Days): 5 Period Pattern: Regular Menstrual Flow: Moderate Menstrual Control: Tampon Dysmenorrhea: (!) Mild Dysmenorrhea Symptoms: Cramping Contraception/Family planning: condoms Sexually active: yes Last Pap: 2021. Results were: normal Last mammogram: 11/23. Results were: normal. Breast MRI 5424 negative  Obstetric History OB History  Gravida Para Term Preterm AB Living  0 0 0 0 0 0  SAB IAB Ectopic Multiple Live Births  0 0 0 0 0     The following portions of the patient's history were reviewed and updated as appropriate: allergies, current medications, past family history, past medical history, past social history, past surgical history, and problem list.  Review of Systems Pertinent items noted in HPI and remainder of comprehensive ROS otherwise negative.   Past medical history, past surgical history, family history and social history were all reviewed and documented in the EPIC chart.   Exam:  Vitals:   07/29/23 1527  BP: 118/78  Weight: 156 lb (70.8 kg)  Height: 5\' 7"  (1.702 m)   Body mass index is 24.43 kg/m.  General appearance:  Normal Thyroid:  Symmetrical, normal in size, without palpable masses or nodularity. Respiratory  Auscultation:  Clear without wheezing or rhonchi Cardiovascular  Auscultation:  Regular rate, without rubs, murmurs or gallops  Edema/varicosities:  Not grossly evident Abdominal  Soft,nontender, without masses, guarding or rebound.  Liver/spleen:  No organomegaly  noted  Hernia:  None appreciated  Skin  Inspection:  Grossly normal Breasts: Examined lying and sitting.   Right: Without masses, retractions, nipple discharge or axillary adenopathy.   Left: Without masses, retractions, nipple discharge or axillary adenopathy. Genitourinary   Inguinal/mons:  Normal without inguinal adenopathy  External genitalia:  Normal appearing vulva with no masses, tenderness, or lesions  BUS/Urethra/Skene's glands:  Normal without masses or exudate  Vagina:  Normal appearing with normal color and discharge, no lesions  Cervix:  Normal appearing without discharge or lesions  Uterus:  Normal in size, shape and contour.  Mobile, nontender  Adnexa/parametria:     Rt: Normal in size, without masses or tenderness.   Lt: Normal in size, without masses or tenderness.  Anus and perineum: Normal   Raynelle Fanning, CMA present for exam  Assessment/Plan:   1. Well woman exam with routine gynecological exam - Cytology - PAP( [facility])  2. Menometrorrhagia Discussed IUD vs low dose OCPs, will consider   3. At high risk for breast cancer Elects for breast MRI every other year, will plan for 2026 (does not do well with IV's) Mammogram yearly    Discussed SBE, pap and STI screening as directed/appropriate. Recommend of exercise weekly, including weight bearing exercise. Encouraged the use of seatbelts and sunscreen. Return in 1 year for annual or as needed.   Arlie Solomons B WHNP-BC 3:43 PM 07/29/2023

## 2023-07-31 LAB — CYTOLOGY - PAP
Adequacy: ABSENT
Comment: NEGATIVE
Diagnosis: NEGATIVE
High risk HPV: NEGATIVE

## 2023-09-02 ENCOUNTER — Other Ambulatory Visit: Payer: Self-pay | Admitting: Radiology

## 2023-09-02 DIAGNOSIS — Z1231 Encounter for screening mammogram for malignant neoplasm of breast: Secondary | ICD-10-CM

## 2023-09-18 ENCOUNTER — Other Ambulatory Visit (HOSPITAL_BASED_OUTPATIENT_CLINIC_OR_DEPARTMENT_OTHER): Payer: Self-pay

## 2023-09-18 DIAGNOSIS — F411 Generalized anxiety disorder: Secondary | ICD-10-CM

## 2023-09-18 MED ORDER — SERTRALINE HCL 25 MG PO TABS: 25.0000 mg | ORAL_TABLET | Freq: Every day | ORAL | 1 refills | Status: DC

## 2023-09-22 ENCOUNTER — Ambulatory Visit (HOSPITAL_BASED_OUTPATIENT_CLINIC_OR_DEPARTMENT_OTHER): Payer: BC Managed Care – PPO | Admitting: Family Medicine

## 2023-10-06 ENCOUNTER — Ambulatory Visit
Admission: RE | Admit: 2023-10-06 | Discharge: 2023-10-06 | Disposition: A | Discharge status: Home / Self Care | Payer: BC Managed Care – PPO | Source: Ambulatory Visit | Attending: Radiology | Admitting: Radiology

## 2023-10-06 DIAGNOSIS — Z1231 Encounter for screening mammogram for malignant neoplasm of breast: Secondary | ICD-10-CM

## 2023-10-07 ENCOUNTER — Ambulatory Visit: Payer: BC Managed Care – PPO

## 2023-10-15 ENCOUNTER — Encounter (HOSPITAL_BASED_OUTPATIENT_CLINIC_OR_DEPARTMENT_OTHER): Payer: Self-pay | Admitting: Family Medicine

## 2023-12-29 ENCOUNTER — Other Ambulatory Visit (HOSPITAL_BASED_OUTPATIENT_CLINIC_OR_DEPARTMENT_OTHER): Payer: Self-pay | Admitting: Family Medicine

## 2023-12-29 DIAGNOSIS — F411 Generalized anxiety disorder: Secondary | ICD-10-CM

## 2024-05-25 ENCOUNTER — Ambulatory Visit: Admitting: Physician Assistant

## 2024-05-25 ENCOUNTER — Encounter: Payer: Self-pay | Admitting: Physician Assistant

## 2024-05-25 VITALS — BP 102/68 | HR 68 | Temp 98.4°F | Ht 67.72 in | Wt 156.6 lb

## 2024-05-25 DIAGNOSIS — F411 Generalized anxiety disorder: Secondary | ICD-10-CM

## 2024-05-25 DIAGNOSIS — Z131 Encounter for screening for diabetes mellitus: Secondary | ICD-10-CM | POA: Diagnosis not present

## 2024-05-25 DIAGNOSIS — Z13220 Encounter for screening for lipoid disorders: Secondary | ICD-10-CM | POA: Diagnosis not present

## 2024-05-25 DIAGNOSIS — Z0000 Encounter for general adult medical examination without abnormal findings: Secondary | ICD-10-CM

## 2024-05-25 LAB — TSH: TSH: 1.02 u[IU]/mL (ref 0.35–5.50)

## 2024-05-25 LAB — COMPREHENSIVE METABOLIC PANEL WITH GFR
ALT: 10 U/L (ref 0–35)
AST: 16 U/L (ref 0–37)
Albumin: 4.3 g/dL (ref 3.5–5.2)
Alkaline Phosphatase: 43 U/L (ref 39–117)
BUN: 18 mg/dL (ref 6–23)
CO2: 28 meq/L (ref 19–32)
Calcium: 8.9 mg/dL (ref 8.4–10.5)
Chloride: 104 meq/L (ref 96–112)
Creatinine, Ser: 0.87 mg/dL (ref 0.40–1.20)
GFR: 80.9 mL/min (ref >60.00)
Glucose, Bld: 81 mg/dL (ref 70–99)
Potassium: 4 meq/L (ref 3.5–5.1)
Sodium: 138 meq/L (ref 135–145)
Total Bilirubin: 0.5 mg/dL (ref 0.2–1.2)
Total Protein: 6.5 g/dL (ref 6.0–8.3)

## 2024-05-25 LAB — CBC WITH DIFFERENTIAL/PLATELET
Basophils Absolute: 0.1 10*3/uL (ref 0.0–0.1)
Basophils Relative: 1.4 % (ref 0.0–3.0)
Eosinophils Absolute: 0.3 10*3/uL (ref 0.0–0.7)
Eosinophils Relative: 4.3 % (ref 0.0–5.0)
HCT: 38.5 % (ref 36.0–46.0)
Hemoglobin: 12.7 g/dL (ref 12.0–15.0)
Lymphocytes Relative: 35.3 % (ref 12.0–46.0)
Lymphs Abs: 2.1 10*3/uL (ref 0.7–4.0)
MCHC: 32.9 g/dL (ref 30.0–36.0)
MCV: 90.4 fl (ref 78.0–100.0)
Monocytes Absolute: 0.5 10*3/uL (ref 0.1–1.0)
Monocytes Relative: 8.9 % (ref 3.0–12.0)
Neutro Abs: 3 10*3/uL (ref 1.4–7.7)
Neutrophils Relative %: 50.1 % (ref 43.0–77.0)
Platelets: 209 10*3/uL (ref 150.0–400.0)
RBC: 4.25 Mil/uL (ref 3.87–5.11)
RDW: 13 % (ref 11.5–15.5)
WBC: 5.9 10*3/uL (ref 4.0–10.5)

## 2024-05-25 LAB — HEMOGLOBIN A1C: Hgb A1c MFr Bld: 5.3 % (ref 4.6–6.5)

## 2024-05-25 LAB — LIPID PANEL
Cholesterol: 164 mg/dL (ref 0–200)
HDL: 66.4 mg/dL (ref >39.00)
LDL Cholesterol: 86 mg/dL (ref 0–99)
NonHDL: 97.56
Total CHOL/HDL Ratio: 2
Triglycerides: 59 mg/dL (ref 0.0–149.0)
VLDL: 11.8 mg/dL (ref 0.0–40.0)

## 2024-05-25 MED ORDER — SERTRALINE HCL 25 MG PO TABS
25.0000 mg | ORAL_TABLET | Freq: Every day | ORAL | 3 refills | Status: AC
Start: 2024-05-25 — End: ?

## 2024-05-25 NOTE — Progress Notes (Signed)
 Patient ID: Joanna Terry, female    DOB: 1980-12-24, 43 y.o.   MRN: [id number]   Assessment & Plan:  Encounter for annual physical exam -     CBC with Differential/Platelet -     Comprehensive metabolic panel with GFR -     Lipid panel -     TSH -     Hemoglobin A1c  Generalized anxiety disorder -     Sertraline  HCl; Take 1 tablet (25 mg total) by mouth daily.  Dispense: 90 tablet; Refill: 3      Assessment and Plan Assessment & Plan Anxiety Anxiety is well-managed with Zoloft , which she wishes to continue under new provider's care. - Prescribe Zoloft  90 tablets with three refills, to be filled at [facility] on [address].  General Health Maintenance She is establishing care with a new provider. She is up to date with gynecological visits and mammograms. She is a former smoker, does not vape, consumes alcohol and caffeine, and is a light sleeper without significant sleep issues. She engages in regular physical activity, including running and strength training. Nutrition is good, with well-managed weight and blood pressure. No skin concerns, sexually active without STD concerns, and no family history of colon cancer. Considering future colon cancer screening options, with discussion of Cologuard and colonoscopy, noting Cologuard's increasing sensitivity and specificity but not at colonoscopy level. - Perform basic blood work as part of the annual physical. - Discuss colon cancer screening options next year, including Cologuard and colonoscopy. - Ensure vision and dental check-ups are up to date. - Advise on maintaining regular physical activity and balanced nutrition. - Schedule follow-up in one year for routine check-up.   Age-appropriate screening and counseling performed today. Will check labs and call with results. Preventive measures discussed and printed in AVS for patient.   Patient Counseling: [x]   Nutrition: Stressed importance of moderation in sodium/caffeine  intake, saturated fat and cholesterol, caloric balance, sufficient intake of fresh fruits, vegetables, and fiber.  [x]   Stressed the importance of regular exercise.   [x]   Substance Abuse: Discussed cessation/primary prevention of tobacco, alcohol, or other drug use; driving or other dangerous activities under the influence; availability of treatment for abuse.   []   Injury prevention: Discussed safety belts, safety helmets, smoke detector, smoking near bedding or upholstery.   []   Sexuality: Discussed sexually transmitted diseases, partner selection, use of condoms, avoidance of unintended pregnancy  and contraceptive alternatives.   [x]   Dental health: Discussed importance of regular tooth brushing, flossing, and dental visits.  [x]   Health maintenance and immunizations reviewed. Please refer to Health maintenance section.        Return in about 1 year (around 05/25/2025) for physical.    Subjective:    Chief Complaint  Patient presents with   New Patient (Initial Visit)    New pt in office to est care and requesting annual CPE during visit; former pt at [facility] however providers kept leaving;     HPI Discussed the use of AI scribe software for clinical note transcription with the patient, who gave verbal consent to proceed.  History of Present Illness Joanna Terry is a 43 year old female who presents for establishing new care and an annual physical exam.  She is currently taking Zoloft  for anxiety, which has been significantly beneficial, and she wishes to continue this medication under the new provider's care.  She has a history of allergies and receives allergy shots. She carries an EpiPen as  a precaution, although she has not experienced any severe allergic reactions.  She mentions a toe issue related to running and plans to seek orthopedic evaluation. She has a history of running half marathons but has reduced her running to shorter distances of three to five miles in  recent years.  No significant health concerns at present, including no bowel issues or skin concerns. She has a history of smoking in her twenties but has since quit. She consumes alcohol and drinks coffee in the mornings.  She experiences light sleep and wakes up frequently, but this is not a new issue. Her menstrual cycle is less predictable since stopping birth control pills a couple of years ago, but remains within a 25 to 30-day cycle.     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   Hives    chronic history   Pain in joint of right knee 01/22/2021   Seasonal allergies     Past Surgical History:  Procedure Laterality Date   TONSILLECTOMY AND ADENOIDECTOMY  age 67 or 39   WISDOM TOOTH EXTRACTION      Family History  Problem Relation Age of Onset   Breast cancer Mother 9       BRCA I/ II negative   Hyperlipidemia Mother    Arthritis Mother    Cancer Mother    Prostate cancer Father 20   Arthritis Father    Cancer Father    Breast cancer Maternal Grandmother 2   Arthritis Maternal Grandmother    Cancer Maternal Grandmother    Alcohol abuse Maternal Grandfather    Cancer Maternal Grandfather    Alcohol abuse Paternal Grandfather    Anxiety disorder Sister    Depression Sister     Social History   Tobacco Use   Smoking status: Former    Current packs/day: 0.00    Types: Cigarettes    Quit date: 11/24/2004    Years since quitting: 19.5    Passive exposure: Never   Smokeless tobacco: Never   Tobacco comments:    Social smoker in my early 67s  Vaping Use   Vaping status: Never Used  Substance Use Topics   Alcohol use: Yes    Alcohol/week: 2.0 standard drinks of alcohol   Drug use: No     No Known Allergies  Review of Systems NEGATIVE UNLESS OTHERWISE INDICATED IN HPI      Objective:     BP 102/68 (BP Location: Left Arm, Patient Position: Sitting, Cuff  Size: Normal)   Pulse 68   Temp 98.4 F (36.9 C) (Temporal)   Ht 5' 7.72 (1.72 m)   Wt 156 lb 9.6 oz (71 kg)   LMP 05/21/2024 (Exact Date)   SpO2 99%   BMI 24.01 kg/m   Wt Readings from Last 3 Encounters:  05/25/24 156 lb 9.6 oz (71 kg)  07/29/23 156 lb (70.8 kg)  06/15/23 156 lb 9.6 oz (71 kg)    BP Readings from Last 3 Encounters:  05/25/24 102/68  07/29/23 118/78  06/15/23 114/77     Physical Exam Vitals and nursing note reviewed.  Constitutional:      Appearance: Normal appearance. She is normal weight. She is not toxic-appearing.  HENT:     Head: Normocephalic and atraumatic.     Right Ear: Tympanic membrane, ear canal and external ear normal.     Left Ear: Tympanic membrane, ear  canal and external ear normal.     Nose: Nose normal.     Mouth/Throat:     Mouth: Mucous membranes are moist.  Eyes:     Extraocular Movements: Extraocular movements intact.     Conjunctiva/sclera: Conjunctivae normal.     Pupils: Pupils are equal, round, and reactive to light.  Cardiovascular:     Rate and Rhythm: Normal rate and regular rhythm.     Pulses: Normal pulses.     Heart sounds: Normal heart sounds.  Pulmonary:     Effort: Pulmonary effort is normal.     Breath sounds: Normal breath sounds.  Abdominal:     General: Abdomen is flat. Bowel sounds are normal.     Palpations: Abdomen is soft.  Musculoskeletal:        General: Normal range of motion.     Cervical back: Normal range of motion and neck supple.  Skin:    General: Skin is warm and dry.  Neurological:     General: No focal deficit present.     Mental Status: She is alert and oriented to person, place, and time.  Psychiatric:        Mood and Affect: Mood normal.        Behavior: Behavior normal.        Thought Content: Thought content normal.        Judgment: Judgment normal.             Brisa Auth M Fredric Slabach, PA-C

## 2024-05-25 NOTE — Patient Instructions (Signed)
 Welcome to [facility]! It was a pleasure meeting you today.   PLEASE NOTE:  If you had any LAB tests please let us know if you have not heard back within a few days. You may see your results on MyChart before we have a chance to review them but we will give you a call once they are reviewed by [place]. If we ordered any REFERRALS today, please let us know if you have not heard from their office within the next two weeks. Let us know through MyChart if you are needing REFILLS, or have your pharmacy send [place] the request. You can also use MyChart to communicate with me or any office staff.  Please try these tips to maintain a healthy lifestyle:  Eat most of your calories during the day when you are active. Eliminate processed foods including packaged sweets (pies, cakes, cookies), reduce intake of potatoes, white bread, white pasta, and white rice. Look for whole grain options, oat flour or almond flour.  Each meal should contain half fruits/vegetables, one quarter protein, and one quarter carbs (no bigger than a computer mouse).  Cut down on sweet beverages. This includes juice, soda, and sweet tea. Also watch fruit intake, though this is a healthier sweet option, it still contains natural sugar! Limit to 3 servings daily.  Drink at least 1 glass of water with each meal and aim for at least 8 glasses (64 ounces) per day.  Exercise at least 150 minutes every week to the best of your ability.    Take Care,  Audel Coakley, PA-C

## 2024-05-26 ENCOUNTER — Ambulatory Visit: Payer: Self-pay | Admitting: Physician Assistant

## 2024-08-03 ENCOUNTER — Ambulatory Visit (INDEPENDENT_AMBULATORY_CARE_PROVIDER_SITE_OTHER): Payer: Self-pay | Admitting: Radiology

## 2024-08-03 ENCOUNTER — Encounter: Payer: Self-pay | Admitting: Radiology

## 2024-08-03 VITALS — BP 104/72 | HR 83 | Ht 68.0 in | Wt 158.0 lb

## 2024-08-03 DIAGNOSIS — Z9189 Other specified personal risk factors, not elsewhere classified: Secondary | ICD-10-CM | POA: Diagnosis not present

## 2024-08-03 DIAGNOSIS — Z1331 Encounter for screening for depression: Secondary | ICD-10-CM

## 2024-08-03 DIAGNOSIS — Z01419 Encounter for gynecological examination (general) (routine) without abnormal findings: Secondary | ICD-10-CM | POA: Diagnosis not present

## 2024-08-03 DIAGNOSIS — Z30018 Encounter for initial prescription of other contraceptives: Secondary | ICD-10-CM | POA: Diagnosis not present

## 2024-08-03 MED ORDER — PHEXXI 1.8-1-0.4 % VA GEL: 5.0000 g | VAGINAL | 6 refills | Status: AC

## 2024-08-03 NOTE — Progress Notes (Signed)
 Joanna Terry 1981/11/08 [id number]   History:  43 y.o. G0 presents for annual exam.Increased risk of breast cancer, TC risk 36.9%. Breast MRI and mammogram negative 2024.   Gynecologic History Patient's last menstrual period was 07/21/2024 (exact date). Period Cycle (Days): 28 Period Duration (Days): 5 Period Pattern: Regular Menstrual Flow: Moderate Menstrual Control: Tampon Dysmenorrhea: (!) Mild Dysmenorrhea Symptoms: Cramping Contraception/Family planning: condoms Sexually active: yes Last Pap: 07/29/23. Results were: normal Last mammogram: 10/06/23. Results were: normal  Obstetric History OB History  Gravida Para Term Preterm AB Living  0 0 0 0 0 0  SAB IAB Ectopic Multiple Live Births  0 0 0 0 0       08/03/2024   10:23 AM 05/25/2024   10:44 AM 03/23/2023    4:02 PM  Depression screen PHQ 2/9  Decreased Interest 0 0 0  Down, Depressed, Hopeless 0 0 0  PHQ - 2 Score 0 0 0  Altered sleeping  1 1  Tired, decreased energy  1 1  Change in appetite  0 0  Feeling bad or failure about yourself   0 0  Trouble concentrating  0 0  Moving slowly or fidgety/restless  0 0  Suicidal thoughts  0 0  PHQ-9 Score  2 2  Difficult doing work/chores  Not difficult at all Not difficult at all     The following portions of the patient's history were reviewed and updated as appropriate: allergies, current medications, past family history, past medical history, past social history, past surgical history, and problem list.  Review of Systems  All other systems reviewed and are negative.   Past medical history, past surgical history, family history and social history were all reviewed and documented in the EPIC chart.  Exam:  Vitals:   08/03/24 1021  BP: 104/72  Pulse: 83  SpO2: 99%  Weight: 158 lb (71.7 kg)  Height: 5' 8 (1.727 m)   Body mass index is 24.02 kg/m.  Physical Exam Vitals and nursing note reviewed. Exam conducted with a chaperone present.   Constitutional:      Appearance: Normal appearance. She is normal weight.  HENT:     Head: Normocephalic and atraumatic.  Neck:     Thyroid: No thyroid mass, thyromegaly or thyroid tenderness.  Cardiovascular:     Rate and Rhythm: Regular rhythm.     Heart sounds: Normal heart sounds.  Pulmonary:     Effort: Pulmonary effort is normal.     Breath sounds: Normal breath sounds.  Chest:  Breasts:    Breasts are symmetrical.     Right: Normal. No inverted nipple, mass, nipple discharge, skin change or tenderness.     Left: Normal. No inverted nipple, mass, nipple discharge, skin change or tenderness.  Abdominal:     General: Abdomen is flat. Bowel sounds are normal.     Palpations: Abdomen is soft.  Genitourinary:    General: Normal vulva.     Vagina: Normal. No vaginal discharge, bleeding or lesions.     Cervix: Normal. No discharge or lesion.     Uterus: Normal. Not enlarged and not tender.      Adnexa: Right adnexa normal and left adnexa normal.       Right: No mass, tenderness or fullness.         Left: No mass, tenderness or fullness.    Lymphadenopathy:     Upper Body:     Right upper body: No axillary adenopathy.  Left upper body: No axillary adenopathy.  Skin:    General: Skin is warm and dry.  Neurological:     Mental Status: She is alert and oriented to person, place, and time.  Psychiatric:        Mood and Affect: Mood normal.        Thought Content: Thought content normal.        Judgment: Judgment normal.      Darice Hoit, CMA present for exam  Assessment/Plan:   1. Well woman exam with routine gynecological exam (Primary) Pap 2027  2. At high risk for breast cancer - MR BREAST BILATERAL W WO CONTRAST INC CAD; Future  3. Encounter for initial prescription of other contraceptives - Lactic Ac-Citric Ac-Pot Bitart (PHEXXI ) 1.8-1-0.4 % GEL; Place 5 g vaginally as directed. Before intercourse  Dispense: 120 g; Refill: 6  4. Depression screen    Return  in about 1 year (around 08/03/2025) for Annual.  GINETTE SHASTA NOVAK WHNP-BC 10:37 AM 08/03/2024

## 2024-08-03 NOTE — Patient Instructions (Signed)
 Preventive Care 45-45 Years Old, Female  Preventive care refers to lifestyle choices and visits with your health care provider that can promote health and wellness. Preventive care visits are also called wellness exams.  What can I expect for my preventive care visit?  Counseling  Your health care provider may ask you questions about your:  Medical history, including:  Past medical problems.  Family medical history.  Pregnancy history.  Current health, including:  Menstrual cycle.  Method of birth control.  Emotional well-being.  Home life and relationship well-being.  Sexual activity and sexual health.  Lifestyle, including:  Alcohol, nicotine or tobacco, and drug use.  Access to firearms.  Diet, exercise, and sleep habits.  Work and work Astronomer.  Sunscreen use.  Safety issues such as seatbelt and bike helmet use.  Physical exam  Your health care provider will check your:  Height and weight. These may be used to calculate your BMI (body mass index). BMI is a measurement that tells if you are at a healthy weight.  Waist circumference. This measures the distance around your waistline. This measurement also tells if you are at a healthy weight and may help predict your risk of certain diseases, such as type 2 diabetes and high blood pressure.  Heart rate and blood pressure.  Body temperature.  Skin for abnormal spots.  What immunizations do I need?    Vaccines are usually given at various ages, according to a schedule. Your health care provider will recommend vaccines for you based on your age, medical history, and lifestyle or other factors, such as travel or where you work.  What tests do I need?  Screening  Your health care provider may recommend screening tests for certain conditions. This may include:  Lipid and cholesterol levels.  Diabetes screening. This is done by checking your blood sugar (glucose) after you have not eaten for a while (fasting).  Pelvic exam and Pap test.  Hepatitis B test.  Hepatitis C  test.  HIV (human immunodeficiency virus) test.  STI (sexually transmitted infection) testing, if you are at risk.  Lung cancer screening.  Colorectal cancer screening.  Mammogram. Talk with your health care provider about when you should start having regular mammograms. This may depend on whether you have a family history of breast cancer.  BRCA-related cancer screening. This may be done if you have a family history of breast, ovarian, tubal, or peritoneal cancers.  Bone density scan. This is done to screen for osteoporosis.  Talk with your health care provider about your test results, treatment options, and if necessary, the need for more tests.  Follow these instructions at home:  Eating and drinking    Eat a diet that includes fresh fruits and vegetables, whole grains, lean protein, and low-fat dairy products.  Take vitamin and mineral supplements as recommended by your health care provider.  Do not drink alcohol if:  Your health care provider tells you not to drink.  You are pregnant, may be pregnant, or are planning to become pregnant.  If you drink alcohol:  Limit how much you have to 0-1 drink a day.  Know how much alcohol is in your drink. In the [place] one drink equals one 12 oz bottle of beer (355 mL), one 5 oz glass of wine (148 mL), or one 1 oz glass of hard liquor (44 mL).  Lifestyle  Brush your teeth every morning and night with fluoride toothpaste. Floss one time each day.  Exercise for at least  30 minutes 5 or more days each week.  Do not use any products that contain nicotine or tobacco. These products include cigarettes, chewing tobacco, and vaping devices, such as e-cigarettes. If you need help quitting, ask your health care provider.  Do not use drugs.  If you are sexually active, practice safe sex. Use a condom or other form of protection to prevent STIs.  If you do not wish to become pregnant, use a form of birth control. If you plan to become pregnant, see your health care provider for a  prepregnancy visit.  Take aspirin only as told by your health care provider. Make sure that you understand how much to take and what form to take. Work with your health care provider to find out whether it is safe and beneficial for you to take aspirin daily.  Find healthy ways to manage stress, such as:  Meditation, yoga, or listening to music.  Journaling.  Talking to a trusted person.  Spending time with friends and family.  Minimize exposure to UV radiation to reduce your risk of skin cancer.  Safety  Always wear your seat belt while driving or riding in a vehicle.  Do not drive:  If you have been drinking alcohol. Do not ride with someone who has been drinking.  When you are tired or distracted.  While texting.  If you have been using any mind-altering substances or drugs.  Wear a helmet and other protective equipment during sports activities.  If you have firearms in your house, make sure you follow all gun safety procedures.  Seek help if you have been physically or sexually abused.  What's next?  Visit your health care provider once a year for an annual wellness visit.  Ask your health care provider how often you should have your eyes and teeth checked.  Stay up to date on all vaccines.  This information is not intended to replace advice given to you by your health care provider. Make sure you discuss any questions you have with your health care provider.  Document Revised: 05/08/2021 Document Reviewed: 05/08/2021  Elsevier Patient Education  2024 [employer].

## 2024-09-12 ENCOUNTER — Other Ambulatory Visit: Payer: Self-pay | Admitting: Radiology

## 2024-09-12 DIAGNOSIS — Z1231 Encounter for screening mammogram for malignant neoplasm of breast: Secondary | ICD-10-CM

## 2024-10-12 ENCOUNTER — Ambulatory Visit
Admission: RE | Admit: 2024-10-12 | Discharge: 2024-10-12 | Disposition: A | Discharge status: Home / Self Care | Source: Ambulatory Visit

## 2024-10-12 DIAGNOSIS — Z1231 Encounter for screening mammogram for malignant neoplasm of breast: Secondary | ICD-10-CM

## 2025-04-03 ENCOUNTER — Other Ambulatory Visit

## 2025-05-30 ENCOUNTER — Encounter: Admitting: Physician Assistant
